# Patient Record
Sex: Male | Born: 1975 | Race: Black or African American | Hispanic: No | Marital: Married | Smoking: Current every day smoker
Health system: Southern US, Community
[De-identification: ages and names within clinical notes are randomized; demographics above are authoritative.]

## PROBLEM LIST (undated history)

## (undated) DIAGNOSIS — I10 Essential (primary) hypertension: Secondary | ICD-10-CM

## (undated) DIAGNOSIS — G8929 Other chronic pain: Secondary | ICD-10-CM

## (undated) DIAGNOSIS — M25569 Pain in unspecified knee: Secondary | ICD-10-CM

---

## 2005-10-26 ENCOUNTER — Emergency Department (HOSPITAL_COMMUNITY): Admission: EM | Admit: 2005-10-26 | Discharge: 2005-10-26 | Payer: Self-pay | Admitting: Emergency Medicine

## 2007-08-16 ENCOUNTER — Emergency Department (HOSPITAL_COMMUNITY): Admission: EM | Admit: 2007-08-16 | Discharge: 2007-08-16 | Payer: Self-pay | Admitting: Emergency Medicine

## 2008-07-14 ENCOUNTER — Emergency Department (HOSPITAL_COMMUNITY): Admission: EM | Admit: 2008-07-14 | Discharge: 2008-07-14 | Payer: Self-pay | Admitting: Emergency Medicine

## 2009-04-09 ENCOUNTER — Inpatient Hospital Stay (HOSPITAL_COMMUNITY): Admission: EM | Admit: 2009-04-09 | Discharge: 2009-04-10 | Payer: Self-pay | Admitting: Emergency Medicine

## 2009-04-09 ENCOUNTER — Encounter: Payer: Self-pay | Admitting: Internal Medicine

## 2009-04-09 ENCOUNTER — Ambulatory Visit: Payer: Self-pay | Admitting: Internal Medicine

## 2009-04-09 ENCOUNTER — Emergency Department (HOSPITAL_COMMUNITY): Admission: EM | Admit: 2009-04-09 | Discharge: 2009-04-09 | Payer: Self-pay | Admitting: Family Medicine

## 2009-04-18 ENCOUNTER — Encounter: Payer: Self-pay | Admitting: Internal Medicine

## 2009-04-25 ENCOUNTER — Telehealth: Payer: Self-pay | Admitting: Internal Medicine

## 2009-04-26 ENCOUNTER — Encounter: Payer: Self-pay | Admitting: Internal Medicine

## 2009-05-09 DIAGNOSIS — F1021 Alcohol dependence, in remission: Secondary | ICD-10-CM

## 2009-05-09 DIAGNOSIS — Z87891 Personal history of nicotine dependence: Secondary | ICD-10-CM

## 2009-05-09 DIAGNOSIS — I498 Other specified cardiac arrhythmias: Secondary | ICD-10-CM | POA: Insufficient documentation

## 2009-05-09 DIAGNOSIS — Z9189 Other specified personal risk factors, not elsewhere classified: Secondary | ICD-10-CM

## 2009-05-10 ENCOUNTER — Encounter (INDEPENDENT_AMBULATORY_CARE_PROVIDER_SITE_OTHER): Payer: Self-pay | Admitting: *Deleted

## 2009-10-02 ENCOUNTER — Emergency Department (HOSPITAL_COMMUNITY): Admission: EM | Admit: 2009-10-02 | Discharge: 2009-10-02 | Payer: Self-pay | Admitting: Family Medicine

## 2010-07-15 ENCOUNTER — Emergency Department (HOSPITAL_COMMUNITY)
Admission: EM | Admit: 2010-07-15 | Discharge: 2010-07-15 | Payer: Self-pay | Source: Home / Self Care | Admitting: Emergency Medicine

## 2010-07-20 ENCOUNTER — Emergency Department (HOSPITAL_COMMUNITY)
Admission: EM | Admit: 2010-07-20 | Discharge: 2010-07-20 | Payer: Self-pay | Source: Home / Self Care | Admitting: Emergency Medicine

## 2010-08-13 ENCOUNTER — Encounter: Payer: Self-pay | Admitting: Internal Medicine

## 2010-10-26 LAB — DIFFERENTIAL
Basophils Relative: 0 % (ref 0–1)
Eosinophils Absolute: 0.5 10*3/uL (ref 0.0–0.7)
Neutrophils Relative %: 61 % (ref 43–77)

## 2010-10-26 LAB — URINE DRUGS OF ABUSE SCREEN W ALC, ROUTINE (REF LAB)
Benzodiazepines.: NEGATIVE
Cocaine Metabolites: NEGATIVE
Opiate Screen, Urine: NEGATIVE
Phencyclidine (PCP): NEGATIVE
Propoxyphene: NEGATIVE

## 2010-10-26 LAB — CBC
HCT: 47.5 % (ref 39.0–52.0)
Hemoglobin: 16.2 g/dL (ref 13.0–17.0)
MCHC: 34.2 g/dL (ref 30.0–36.0)
MCHC: 34.6 g/dL (ref 30.0–36.0)
RBC: 5.05 MIL/uL (ref 4.22–5.81)
RDW: 13.9 % (ref 11.5–15.5)
RDW: 14.1 % (ref 11.5–15.5)
WBC: 7.9 10*3/uL (ref 4.0–10.5)

## 2010-10-26 LAB — COMPREHENSIVE METABOLIC PANEL
Calcium: 9 mg/dL (ref 8.4–10.5)
Chloride: 107 mEq/L (ref 96–112)
Creatinine, Ser: 1.02 mg/dL (ref 0.4–1.5)
GFR calc Af Amer: >60 mL/min (ref >60)
Glucose, Bld: 85 mg/dL (ref 70–99)
Potassium: 3.7 mEq/L (ref 3.5–5.1)
Sodium: 140 mEq/L (ref 135–145)
Total Bilirubin: 0.8 mg/dL (ref 0.3–1.2)

## 2010-10-26 LAB — CARDIAC PANEL(CRET KIN+CKTOT+MB+TROPI)
Relative Index: 0.6 (ref 0.0–2.5)
Total CK: 170 U/L (ref 7–232)

## 2010-10-26 LAB — LIPID PANEL
HDL: 42 mg/dL (ref >39)
LDL Cholesterol: 70 mg/dL (ref 0–99)
Total CHOL/HDL Ratio: 3.2 RATIO
Triglycerides: 110 mg/dL (ref <150)

## 2010-10-26 LAB — CK TOTAL AND CKMB (NOT AT ARMC)
Relative Index: 0.7 (ref 0.0–2.5)
Total CK: 267 U/L — ABNORMAL HIGH (ref 7–232)

## 2010-10-26 LAB — APTT: aPTT: 188 seconds — ABNORMAL HIGH (ref 24–37)

## 2010-10-26 LAB — PROTIME-INR: INR: 1 (ref 0.00–1.49)

## 2010-11-26 ENCOUNTER — Inpatient Hospital Stay (INDEPENDENT_AMBULATORY_CARE_PROVIDER_SITE_OTHER)
Admission: RE | Admit: 2010-11-26 | Discharge: 2010-11-26 | Disposition: A | Discharge status: Home / Self Care | Payer: Self-pay | Source: Ambulatory Visit | Attending: Emergency Medicine | Admitting: Emergency Medicine

## 2010-11-26 DIAGNOSIS — K5900 Constipation, unspecified: Secondary | ICD-10-CM

## 2010-11-26 DIAGNOSIS — K602 Anal fissure, unspecified: Secondary | ICD-10-CM

## 2011-04-11 LAB — POCT CARDIAC MARKERS
CKMB, poc: 1.8
Operator id: 265201

## 2013-05-06 ENCOUNTER — Encounter (HOSPITAL_COMMUNITY): Payer: Self-pay | Admitting: Emergency Medicine

## 2013-05-06 ENCOUNTER — Emergency Department (HOSPITAL_COMMUNITY)
Admission: EM | Admit: 2013-05-06 | Discharge: 2013-05-06 | Disposition: A | Discharge status: Home / Self Care | Payer: Self-pay | Attending: Emergency Medicine | Admitting: Emergency Medicine

## 2013-05-06 ENCOUNTER — Emergency Department (HOSPITAL_COMMUNITY): Payer: Self-pay

## 2013-05-06 DIAGNOSIS — I10 Essential (primary) hypertension: Secondary | ICD-10-CM | POA: Insufficient documentation

## 2013-05-06 DIAGNOSIS — Z87828 Personal history of other (healed) physical injury and trauma: Secondary | ICD-10-CM | POA: Insufficient documentation

## 2013-05-06 DIAGNOSIS — M25569 Pain in unspecified knee: Secondary | ICD-10-CM | POA: Insufficient documentation

## 2013-05-06 DIAGNOSIS — M25562 Pain in left knee: Secondary | ICD-10-CM

## 2013-05-06 DIAGNOSIS — F17200 Nicotine dependence, unspecified, uncomplicated: Secondary | ICD-10-CM | POA: Insufficient documentation

## 2013-05-06 DIAGNOSIS — G8929 Other chronic pain: Secondary | ICD-10-CM | POA: Insufficient documentation

## 2013-05-06 HISTORY — DX: Other chronic pain: G89.29

## 2013-05-06 HISTORY — DX: Pain in unspecified knee: M25.569

## 2013-05-06 HISTORY — DX: Essential (primary) hypertension: I10

## 2013-05-06 MED ORDER — NAPROXEN 500 MG PO TABS: 500.0000 mg | ORAL_TABLET | Freq: Two times a day (BID) | ORAL | Status: DC

## 2013-05-06 MED ORDER — IBUPROFEN 400 MG PO TABS
800.0000 mg | ORAL_TABLET | Freq: Once | ORAL | Status: AC
  Administered 2013-05-06: 800 mg via ORAL
  Filled 2013-05-06: qty 2
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
## 2013-05-06 NOTE — ED Notes (Signed)
Pt is here for left knee pain.  Pt denies any recent injury and has had pain in left knee for 10 years
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
## 2013-05-08 ENCOUNTER — Encounter (HOSPITAL_COMMUNITY): Payer: Self-pay | Admitting: Emergency Medicine

## 2013-05-08 ENCOUNTER — Emergency Department (HOSPITAL_COMMUNITY)
Admission: EM | Admit: 2013-05-08 | Discharge: 2013-05-08 | Disposition: A | Discharge status: Home / Self Care | Payer: Self-pay | Attending: Emergency Medicine | Admitting: Emergency Medicine

## 2013-05-08 DIAGNOSIS — G8929 Other chronic pain: Secondary | ICD-10-CM | POA: Insufficient documentation

## 2013-05-08 DIAGNOSIS — Z791 Long term (current) use of non-steroidal anti-inflammatories (NSAID): Secondary | ICD-10-CM | POA: Insufficient documentation

## 2013-05-08 DIAGNOSIS — I10 Essential (primary) hypertension: Secondary | ICD-10-CM | POA: Insufficient documentation

## 2013-05-08 DIAGNOSIS — F17200 Nicotine dependence, unspecified, uncomplicated: Secondary | ICD-10-CM | POA: Insufficient documentation

## 2013-05-08 DIAGNOSIS — R21 Rash and other nonspecific skin eruption: Secondary | ICD-10-CM | POA: Insufficient documentation

## 2013-05-08 MED ORDER — HYDROXYZINE HCL 25 MG PO TABS: 25.0000 mg | ORAL_TABLET | Freq: Four times a day (QID) | ORAL | Status: DC

## 2013-05-08 MED ORDER — HYDROXYZINE HCL 25 MG PO TABS
25.0000 mg | ORAL_TABLET | Freq: Once | ORAL | Status: AC
  Administered 2013-05-08: 25 mg via ORAL
  Filled 2013-05-08: qty 1

## 2013-05-08 MED ORDER — PERMETHRIN 5 % EX CREA: TOPICAL_CREAM | CUTANEOUS | Status: DC

## 2013-05-08 NOTE — ED Provider Notes (Signed)
CSN: [id number]     Arrival date & time 05/08/13  1158 History  This chart was scribed for non-physician practitioner, Junious Silk, PA-C working with Doug Sou, MD by Greggory Stallion, ED scribe. This patient was seen in room TR03C/TR03C and the patient's care was started at 12:13 PM.   Chief Complaint  Patient presents with  . Rash   The history is provided by the patient. No language interpreter was used.   HPI Comments: Jonathan Caldwell is a 37 y.o. male who presents to the Emergency Department complaining of gradual onset, constant diffuse itchy rash that started one day ago. Pt states the rash started on his hands and has moved up his arms and to the rest of his body. He has not seen any bugs. He has not used any new products or soaps. No one else has this rash. Pt denies mouth sores, trouble breathing, SOB, trouble swallowing.   Past Medical History  Diagnosis Date  . Chronic knee pain   . Hypertension    No past surgical history on file. No family history on file. History  Substance Use Topics  . Smoking status: Current Every Day Smoker -- 0.50 packs/day  . Smokeless tobacco: Not on file  . Alcohol Use: Yes    Review of Systems  HENT: Negative for mouth sores and trouble swallowing.   Respiratory: Negative for shortness of breath.   Skin: Positive for rash.  All other systems reviewed and are negative.    Allergies  Review of patient's allergies indicates no known allergies.  Home Medications   Current Outpatient Rx  Name  Route  Sig  Dispense  Refill  . naproxen (NAPROSYN) 500 MG tablet   Oral   Take 1 tablet (500 mg total) by mouth 2 (two) times daily.   30 tablet   0    BP 148/95  Pulse 78  Temp(Src) 98.7 F (37.1 C) (Oral)  Resp 20  Ht 5\' 8"  (1.727 m)  Wt 214 lb (97.07 kg)  BMI 32.55 kg/m2  SpO2 95%  Physical Exam  Nursing note and vitals reviewed. Constitutional: He is oriented to person, place, and time. He appears well-developed and  well-nourished. No distress.  HENT:  Head: Normocephalic and atraumatic.  Right Ear: External ear normal.  Left Ear: External ear normal.  Nose: Nose normal.  No uvular swelling. Maintaining secretions. No sores on face or in mouth.  Eyes: Conjunctivae are normal.  Neck: Normal range of motion. No tracheal deviation present.  Cardiovascular: Normal rate, regular rhythm and normal heart sounds.   Pulmonary/Chest: Effort normal and breath sounds normal. No stridor. No respiratory distress.  Abdominal: Soft. He exhibits no distension. There is no tenderness.  Musculoskeletal: Normal range of motion.  Neurological: He is alert and oriented to person, place, and time.  Skin: Skin is warm and dry. He is not diaphoretic.  Scattered papules diffusely over upper extremities bilaterally. Some excoriations noted.   Psychiatric: He has a normal mood and affect. His behavior is normal.    ED Course  Procedures (including critical care time)  DIAGNOSTIC STUDIES: Oxygen Saturation is 95% on RA, adequate by my interpretation.    COORDINATION OF CARE: 12:15 PM-Discussed treatment plan which includes cream with pt at bedside and pt agreed to plan.   Labs Review Labs Reviewed - No data to display Imaging Review No results found.  EKG Interpretation   None       MDM   1. Rash  Patient presents with pruritic rash diffusely over arms. Consistent with bite. No sensation that throat is closing, maintaining secretions. Will treat with Elimite cream and Atarax. Discussed that he needs to apply Elimite cream from neck to soles of feet and leave on for 14 hours and then rinse. Repeat in 1 week if still having symptoms. Also discussed the use of Nix spray on all couches and washing of all sheets. Reasons to return to ED were discussed. Vital signs stable for discharge. Patient / Family / Caregiver informed of clinical course, understand medical decision-making process, and agree with plan.     I  personally performed the services described in this documentation, which was scribed in my presence. The recorded information has been reviewed and is accurate.    Mora Bellman, PA-C 05/08/13 [phone]

## 2013-05-08 NOTE — ED Provider Notes (Signed)
Medical screening examination/treatment/procedure(s) were performed by non-physician practitioner and as supervising physician I was immediately available for consultation/collaboration.  Ambrosia Wisnewski, MD 05/08/13 1540 

## 2013-05-08 NOTE — ED Notes (Signed)
Reports itching and rash to entire body, including arms and hands since yesterday. No acute distress noted.

## 2013-05-11 NOTE — ED Provider Notes (Signed)
Medical screening examination/treatment/procedure(s) were performed by non-physician practitioner and as supervising physician I was immediately available for consultation/collaboration.  Libby Goehring [place], MD 05/11/13 0042 

## 2013-08-24 ENCOUNTER — Emergency Department (HOSPITAL_COMMUNITY): Payer: Self-pay

## 2013-08-24 ENCOUNTER — Emergency Department (HOSPITAL_COMMUNITY)
Admission: EM | Admit: 2013-08-24 | Discharge: 2013-08-25 | Disposition: A | Discharge status: Home / Self Care | Payer: Self-pay | Attending: Emergency Medicine | Admitting: Emergency Medicine

## 2013-08-24 ENCOUNTER — Encounter (HOSPITAL_COMMUNITY): Payer: Self-pay | Admitting: Emergency Medicine

## 2013-08-24 DIAGNOSIS — K089 Disorder of teeth and supporting structures, unspecified: Secondary | ICD-10-CM | POA: Insufficient documentation

## 2013-08-24 DIAGNOSIS — F17200 Nicotine dependence, unspecified, uncomplicated: Secondary | ICD-10-CM | POA: Insufficient documentation

## 2013-08-24 DIAGNOSIS — R079 Chest pain, unspecified: Secondary | ICD-10-CM | POA: Insufficient documentation

## 2013-08-24 DIAGNOSIS — K0889 Other specified disorders of teeth and supporting structures: Secondary | ICD-10-CM

## 2013-08-24 DIAGNOSIS — G8929 Other chronic pain: Secondary | ICD-10-CM | POA: Insufficient documentation

## 2013-08-24 DIAGNOSIS — K029 Dental caries, unspecified: Secondary | ICD-10-CM | POA: Insufficient documentation

## 2013-08-24 DIAGNOSIS — I10 Essential (primary) hypertension: Secondary | ICD-10-CM | POA: Insufficient documentation

## 2013-08-24 LAB — POCT I-STAT TROPONIN I
Troponin i, poc: 0.01 ng/mL (ref 0.00–0.08)
Troponin i, poc: 0.01 ng/mL (ref 0.00–0.08)

## 2013-08-24 LAB — CBC
HEMATOCRIT: 42.1 % (ref 39.0–52.0)
HEMOGLOBIN: 15 g/dL (ref 13.0–17.0)
MCH: 31.5 pg (ref 26.0–34.0)
MCHC: 35.6 g/dL (ref 30.0–36.0)
MCV: 88.4 fL (ref 78.0–100.0)
Platelets: 167 10*3/uL (ref 150–400)
RBC: 4.76 MIL/uL (ref 4.22–5.81)
RDW: 13.3 % (ref 11.5–15.5)
WBC: 10.5 10*3/uL (ref 4.0–10.5)

## 2013-08-24 LAB — BASIC METABOLIC PANEL
BUN: 14 mg/dL (ref 6–23)
CO2: 23 meq/L (ref 19–32)
Calcium: 9.1 mg/dL (ref 8.4–10.5)
Chloride: 101 mEq/L (ref 96–112)
Creatinine, Ser: 1.12 mg/dL (ref 0.50–1.35)
GFR calc Af Amer: >90 mL/min (ref >90)
GFR, EST NON AFRICAN AMERICAN: 82 mL/min — AB (ref >90)
GLUCOSE: 84 mg/dL (ref 70–99)
POTASSIUM: 3.9 meq/L (ref 3.7–5.3)
Sodium: 137 mEq/L (ref 137–147)

## 2013-08-24 LAB — PRO B NATRIURETIC PEPTIDE: Pro B Natriuretic peptide (BNP): 180.8 pg/mL — ABNORMAL HIGH (ref 0–125)

## 2013-08-24 MED ORDER — HYDROCODONE-ACETAMINOPHEN 5-325 MG PO TABS: 1.0000 | ORAL_TABLET | Freq: Four times a day (QID) | ORAL | Status: DC | PRN

## 2013-08-24 MED ORDER — FAMOTIDINE 20 MG PO TABS: 20.0000 mg | ORAL_TABLET | Freq: Two times a day (BID) | ORAL | Status: DC

## 2013-08-24 MED ORDER — PENICILLIN V POTASSIUM 500 MG PO TABS: 500.0000 mg | ORAL_TABLET | Freq: Four times a day (QID) | ORAL | Status: AC

## 2013-08-24 MED ORDER — HYDROCODONE-ACETAMINOPHEN 5-325 MG PO TABS
1.0000 | ORAL_TABLET | Freq: Once | ORAL | Status: AC
  Administered 2013-08-25: 1 via ORAL
  Filled 2013-08-24: qty 1

## 2013-08-24 MED ORDER — GI COCKTAIL ~~LOC~~
30.0000 mL | Freq: Once | ORAL | Status: AC
  Administered 2013-08-24: 30 mL via ORAL
  Filled 2013-08-24: qty 30

## 2013-08-24 NOTE — ED Provider Notes (Signed)
CSN: [id number]     Arrival date & time 08/24/13  2025 History   First MD Initiated Contact with Patient 08/24/13 2036     Chief Complaint  Patient presents with  . Chest Pain   (Consider location/radiation/quality/duration/timing/severity/associated sxs/prior Treatment) HPI Jonathan Caldwell is a 38 y.o. male who presents to ED with complaint of chest pain. Pt states his pain began around 4:30 PM after eating late lunch. States was sitting down. States pain described as sharp, retrosternal. Denied any associated sob, nausea, dizziness, diaphoresis. States hx of CP several years ago, was workedvup at that time, negative stress test. Was diagnosed with HTN, not taking any medications for it. Pt states he is unsure of what his cholesterol is, no hx of diabetes. States he is a smoker. Denies any significant family history of cardiac disease. No exertional symptoms. Took BC powder prior to coming in.   Past Medical History  Diagnosis Date  . Chronic knee pain   . Hypertension    History reviewed. No pertinent past surgical history. No family history on file. History  Substance Use Topics  . Smoking status: Current Every Day Smoker -- 0.50 packs/day  . Smokeless tobacco: Not on file  . Alcohol Use: Yes    Review of Systems  Constitutional: Negative for fever and chills.  HENT: Positive for dental problem.   Respiratory: Negative for cough, chest tightness and shortness of breath.   Cardiovascular: Positive for chest pain. Negative for palpitations and leg swelling.  Gastrointestinal: Negative for nausea, vomiting, abdominal pain, diarrhea and abdominal distention.  Genitourinary: Negative for dysuria.  Musculoskeletal: Negative for arthralgias, myalgias, neck pain and neck stiffness.  Skin: Negative for rash.  Allergic/Immunologic: Negative for immunocompromised state.  Neurological: Negative for dizziness, weakness, light-headedness, numbness and headaches.    Allergies  Review of  patient's allergies indicates no known allergies.  Home Medications   Current Outpatient Rx  Name  Route  Sig  Dispense  Refill  . Aspirin-Salicylamide-Caffeine (BC HEADACHE) 325-95-16 MG TABS   Oral   Take 1 Package by mouth daily as needed (for headache).         Marland Kitchen ibuprofen (ADVIL,MOTRIN) 200 MG tablet   Oral   Take 200 mg by mouth every 6 (six) hours as needed for moderate pain.          BP 153/102  Pulse 51  Temp(Src) 97.5 F (36.4 C) (Oral)  Resp 8  SpO2 98% Physical Exam  Nursing note and vitals reviewed. Constitutional: He is oriented to person, place, and time. He appears well-developed and well-nourished. No distress.  HENT:  Head: Normocephalic and atraumatic.  Left lower 2nd molar cavity. Tender to palpation. No trismus. No swelling under tongue. No abscess  Eyes: Conjunctivae are normal.  Neck: Neck supple.  Cardiovascular: Normal rate, regular rhythm and normal heart sounds.   Pulmonary/Chest: Effort normal. No respiratory distress. He has no wheezes. He has no rales.  Abdominal: Soft. Bowel sounds are normal. He exhibits no distension. There is no tenderness. There is no rebound.  Musculoskeletal: He exhibits no edema.  Neurological: He is alert and oriented to person, place, and time.  Skin: Skin is warm and dry.    ED Course  Procedures (including critical care time) Labs Review Labs Reviewed  BASIC METABOLIC PANEL - Abnormal; Notable for the following:    GFR calc non Af Amer 82 (*)    All other components within normal limits  PRO B NATRIURETIC PEPTIDE - Abnormal; Notable for  the following:    Pro B Natriuretic peptide (BNP) 180.8 (*)    All other components within normal limits  CBC  POCT I-STAT TROPONIN I   Imaging Review Dg Chest 2 View  08/24/2013   CLINICAL DATA:  Left-sided chest pain.  EXAM: CHEST  2 VIEW  COMPARISON:  04/09/2009 and 07/14/2008  FINDINGS: The heart size and mediastinal contours are within normal limits. Both lungs are  clear. The visualized skeletal structures are unremarkable.  IMPRESSION: Normal chest.   Electronically Signed   By: Geanie CooleyJim  Maxwell M.D.   On: 08/24/2013 20:59    EKG Interpretation    Date/Time:  Tuesday August 24 2013 20:26:29 EST Ventricular Rate:  64 PR Interval:  160 QRS Duration: 92 QT Interval:  400 QTC Calculation: 412 R Axis:   83 Text Interpretation:  Normal sinus rhythm Nonspecific T wave abnormality No significant change since last tracing Confirmed by DOCHERTY  MD, MEGAN ([id number]) on 08/24/2013 8:37:31 PM            MDM   1. Chest pain   2. Toothache      10:54 PM Symptoms resolved with GI cocktail. Will do delta trop at 6hrs. Pt states symptoms began at 4:30.  At this time labs negative. CXR negative.  Will continue to monitor.   11:50 PM Pt now complaining of worsening dental pain. CP free. Troponin x2 negative. Will treat with norco, penicillin. Follow up with pcp and dentist as soon as able.   Filed Vitals:   08/24/13 2105 08/24/13 2130 08/24/13 2200 08/24/13 2230  BP: 152/109 155/98 156/104 153/102  Pulse: 57 55 52 51  Temp:      TempSrc:      Resp:  12 18 8   SpO2: 97% 98% 98% 98%       Shatona Andujar A Waylyn Tenbrink, PA-C 08/24/13 2350

## 2013-08-24 NOTE — ED Notes (Signed)
Pt. reports left chest pain onset today , non radiating with slight SOB , denies nausea or diaphoresis .

## 2013-08-24 NOTE — Discharge Instructions (Signed)
Take pepcid as prescribed for chest pain- possible acid reflux. Follow up with primary care doctor. Take penicillin, ibuprofen, and norco for severe dental pain. Please follow up with a dentist. Return if worsening.    Chest Pain (Nonspecific) Chest pain has many causes. Your pain could be caused by something serious, such as a heart attack or a blood clot in the lungs. It could also be caused by something less serious, such as a chest bruise or a virus. Follow up with your doctor. More lab tests or other studies may be needed to find the cause of your pain. Most of the time, nonspecific chest pain will improve within 2 to 3 days of rest and mild pain medicine. HOME CARE  For chest bruises, you may put ice on the sore area for 15-20 minutes, 03-04 times a day. Do this only if it makes you feel better.  Put ice in a plastic bag.  Place a towel between the skin and the bag.  Rest for the next 2 to 3 days.  Go back to work if the pain improves.  See your doctor if the pain lasts longer than 1 to 2 weeks.  Only take medicine as told by your doctor.  Quit smoking if you smoke. GET HELP RIGHT AWAY IF:   There is more pain or pain that spreads to the arm, neck, jaw, back, or belly (abdomen).  You have shortness of breath.  You cough more than usual or cough up blood.  You have very bad back or belly pain, feel sick to your stomach (nauseous), or throw up (vomit).  You have very bad weakness.  You pass out (faint).  You have a fever. Any of these problems may be serious and may be an emergency. Do not wait to see if the problems will go away. Get medical help right away. Call your local emergency services 911 in [place] Do not drive yourself to the hospital. MAKE SURE YOU:   Understand these instructions.  Will watch this condition.  Will get help right away if you or your child is not doing well or gets worse. Document Released: 12/25/2007 Document Revised: 09/30/2011 Document  Reviewed: 12/25/2007 [facility] Patient Information 2014 [place], [place].   Emergency Department Resource Guide 1) Find a Doctor and Pay Out of Pocket Although you won't have to find out who is covered by your insurance plan, it is a good idea to ask around and get recommendations. You will then need to call the office and see if the doctor you have chosen will accept you as a new patient and what types of options they offer for patients who are self-pay. Some doctors offer discounts or will set up payment plans for their patients who do not have insurance, but you will need to ask so you aren't surprised when you get to your appointment.  2) Contact Your Local Health Department Not all health departments have doctors that can see patients for sick visits, but many do, so it is worth a call to see if yours does. If you don't know where your local health department is, you can check in your phone book. The CDC also has a tool to help you locate your state's health department, and many state websites also have listings of all of their local health departments.  3) Find a Walk-in Clinic If your illness is not likely to be very severe or complicated, you may want to try a walk in clinic. These are popping  up all over the country in pharmacies, drugstores, and shopping centers. They're usually staffed by nurse practitioners or physician assistants that have been trained to treat common illnesses and complaints. They're usually fairly quick and inexpensive. However, if you have serious medical issues or chronic medical problems, these are probably not your best option.  No Primary Care Doctor: - Call Health Connect at  [phone] - they can help you locate a primary care doctor that  accepts your insurance, provides certain services, etc. - Physician Referral Service- [phone]  Chronic Pain Problems: Organization         Address  Phone   Notes  [facility]  [phone] Patients  need to be referred by their primary care doctor.   Medication Assistance: Organization         Address  Phone   Notes  [facility] Medication [facility] [address] [place], [place] [address] [phone] --Must be a resident of [facility] -- Must have NO insurance coverage whatsoever (no Medicaid/ Medicare, etc.) -- The pt. MUST have a primary care doctor that directs their care regularly and follows them in the community   [facility]  [phone]   [employer]  [phone]    Agencies that provide inexpensive medical care: Organization         Address  Phone   Notes  [facility]  [phone]   [facility]    [phone]   [facility] [address] [place], [place] [address] [phone]   [facility] [address] [place] [place] [phone]   Planned Parenthood    [phone]   [facility]    [phone]   [facility]  [address], [place] Phone:  [phone], Fax:  [phone] Hours of Operation:  9 am - 6 pm, M-F.  Also accepts Medicaid/Medicare and self-pay.  [facility]  [address], [place] Phone: [phone], Fax: [phone]. Hours of Operation:  8:30 am - 5:30 pm, M-F.  Also accepts Medicaid and self-pay.  [facility] [place] [address], [place] Phone: [phone]   [facility] [address] Natasha Bence [place], [place] [phone], Ext. [phone] Mondays & Thursdays: 7-9 AM.  First 15 patients are seen on a first come, first serve basis.    Medicaid-accepting [facility] Providers:  Organization         Address  Phone   Notes  [facility] [address], [place] [phone] Also accepts self-pay patients.  [facility] [address] Laurell Josephs [place], [place]  [phone]     [facility] [address], [place] [phone]   [facility] [address], [place] [phone]   Renaye Rakers [address], [place]   [phone] Only accepts [place] patients after they have their name applied to their card.   Self-Pay (no insurance) in [facility]:  Organization         Address  Phone   Notes  Sickle Cell Patients, [facility] [address] [place], [place] [phone]   [facility] [address] [place], [place] [phone]   [facility] [place]  [address], [place] [phone]   [facility]/Dr. Osei-Bonsu  [address], [place] or [address], [place] [phone] Phone number for both [place] and [place] locations is the same.  [facility] [address], [place] [phone]   [facility] [address], [place] or [address] [phone]   [facility] [address] [place] [phone], phone; [phone], fax Sees patients 1st and 3rd Saturday of every month.  Must not qualify for public or private insurance (i.e. Medicaid, Medicare, [place] Health Choice, Veterans' Benefits)  Household income should be no more than 200% of the poverty level The clinic cannot treat you if you are pregnant or think you are pregnant  Sexually transmitted diseases are not treated at the clinic.    Dental Care: Organization         Address  Phone  Notes  [facility] Department of [facility] [address] [place], [place] [phone] Accepts children up to age 56 who are enrolled in [place] or [place] Health Choice; pregnant women with a Medicaid card; and children who have applied for Medicaid or [place] Health Choice, but were declined,  whose parents can pay a reduced fee at time of service.  [facility]  [address], [place] [phone] Accepts children up to age 48 who are enrolled in [place] or [place] Health Choice; pregnant women with a Medicaid card; and children who have applied for Medicaid or [place] Health Choice, but were declined, whose parents can pay a reduced fee at time of service.  [facility]  [address] [place], [place] [phone] Patients are seen by appointment only. Walk-ins are not accepted. [facility] will see patients 59 years of age and older. Monday - Tuesday (8am-5pm) Most Wednesdays (8:30-5pm) $30 per visit, cash only  [facility]  [address], [facility] [phone] Patients are seen by appointment only. Walk-ins are not accepted. [facility] will see patients 73 years of age and older. One Wednesday Evening (Monthly: Volunteer Based).  $30 per visit, cash only  [employer]  [phone] for adults; Children under age 4, call Graduate Pediatric Dentistry at [phone]. Children aged 65-14, please call [phone] to request a pediatric application.  Dental services are provided in all areas of dental care including fillings, crowns and bridges, complete and partial dentures, implants, gum treatment, root canals, and extractions. Preventive care is also provided. Treatment is provided to both adults and children. Patients are selected via a lottery and there is often a waiting list.   [facility] [address] [place] [employer]  [phone] [email or web address]   [facility] [address] [place], [place] [phone], Ext. [phone] Second and Fourth Thursday of each month, opens at 6:30 AM; Clinic ends at 9 AM.  Patients are seen on a first-come first-served basis, and a limited number are seen during each clinic.   [facility]  [address] Ether Griffins [place], [place] [phone]   Eligibility Requirements You must have lived in [place], [place], or [place] counties for at least the last three months.   You cannot be eligible for state or federal sponsored [place], including [employer], [place],  or Medicare.   You generally cannot be eligible for healthcare insurance through your employer.    How to apply: Eligibility screenings are held every Tuesday and Wednesday afternoon from 1:00 pm until 4:00 pm. You do not need an appointment for the interview!  [facility] [address], [place], [place] [phone]   [facility]  [phone]   [facility]  [phone]   [facility]  [phone]    Behavioral Health Resources in the Community: Intensive Outpatient Programs Organization         Address  Phone  Notes  [facility] [address], [place], [place] [phone]   [facility] [address], [place], [place] [phone]   [facility] [address], [place], [place]  [phone]   [facility] [address],  [place], [place] [phone] or [phone]   Substance Abuse Resources Organization         Address  Phone  Notes  [facility]  [phone]   [facility]  [phone]   [facility]  [phone]   [facility]  [phone]   Residential & Outpatient Substance Abuse Program  [phone]   Psychological Services Organization         Address  Phone  Notes  [facility]  [phone]- [phone]   [facility]  [phone]- [phone]   [facility] [address], [place] [phone] or [phone]    Mobile Crisis Teams Organization         Address  Phone  Notes  [facility], Mobile Crisis Care Unit   [phone]   [facility]  [address] [place], [place] [phone]   Doristine LocksSharon DeEsch [address] [place] [phone]    Self-Help/Support Groups Organization         Address  Phone             Notes  [facility] [place] - variety of support groups  [phone]- [phone] Call for more information  Narcotics Anonymous (NA), [facility] [address], [place]  2 meetings at this location   Statisticianesidential Treatment Programs Organization         Address  Phone  Notes  [facility] [address],    [place]  [phone]   [facility]  [address], [place] [phone], [place], [place] [phone]   [facility] [address], [place] [phone] Admissions: 8am-3pm M-F  [facility] [address]    [place], [place] [phone]   [facility] [address], [place], [place] [phone]   [facility] [address]  [place], [place] [phone]   [facility] - Intensive Outpatient [address], [place], [place] [phone]   [facility] ([facility] [address]  [place], [place] [phone] or [phone]   [facility] ([facility]) [address] [place], [place] [phone] Accepts Medicaid  [facility] [address]  [place] [phone] Substance Abuse/Addiction Treatment   [facility] Resources Organization         Address  Phone  Notes  [facility]  [phone]) [phone]   Angie FavaJulie Brannon, PhD [address] M[address]yn Skeeters [place], [place]   [phone] or [phone]   [facility]al   [address] [place], [place] [phone]   [facility] [address], [place], [place] [phone] Insurance/Medicaid/sponsorship through [facility] and Families [address]                                     [place], [place] [phone] Therapy/tele-psych/case  [facility] [address]   [place], [place] [phone]    Dr. Lolly Mustache  [phone]   [facility]  [employer] [facility] Dept. 1) [address], [place] 2) [address] [place] 3)  [address], [place] [phone]  [phone]   [facility] Child Abuse Hotline (509)[phone] or 8474257907 (After Hours)

## 2013-08-24 NOTE — ED Notes (Signed)
Family at bedside. 

## 2013-08-25 NOTE — ED Provider Notes (Signed)
Medical screening examination/treatment/procedure(s) were performed by non-physician practitioner and as supervising physician I was immediately available for consultation/collaboration.  EKG Interpretation    Date/Time:  Tuesday August 24 2013 20:26:29 EST Ventricular Rate:  64 PR Interval:  160 QRS Duration: 92 QT Interval:  400 QTC Calculation: 412 R Axis:   83 Text Interpretation:  Normal sinus rhythm Nonspecific T wave abnormality No significant change since last tracing Confirmed by Chiron Campione  MD, Earlin Sweeden ([id number]) on 08/24/2013 8:37:31 PM              Shanna CiscoMegan E Kylii Ennis, MD 08/25/13 0006

## 2013-08-26 ENCOUNTER — Encounter (HOSPITAL_COMMUNITY): Payer: Self-pay | Admitting: Emergency Medicine

## 2013-08-26 ENCOUNTER — Emergency Department (HOSPITAL_COMMUNITY)
Admission: EM | Admit: 2013-08-26 | Discharge: 2013-08-26 | Disposition: A | Discharge status: Home / Self Care | Payer: Self-pay | Attending: Emergency Medicine | Admitting: Emergency Medicine

## 2013-08-26 ENCOUNTER — Emergency Department (HOSPITAL_COMMUNITY)
Admission: EM | Admit: 2013-08-26 | Discharge: 2013-08-26 | Discharge status: Against Medical Advice | Payer: Self-pay | Attending: Emergency Medicine | Admitting: Emergency Medicine

## 2013-08-26 DIAGNOSIS — R0789 Other chest pain: Secondary | ICD-10-CM | POA: Insufficient documentation

## 2013-08-26 DIAGNOSIS — Z79899 Other long term (current) drug therapy: Secondary | ICD-10-CM | POA: Insufficient documentation

## 2013-08-26 DIAGNOSIS — K219 Gastro-esophageal reflux disease without esophagitis: Secondary | ICD-10-CM | POA: Insufficient documentation

## 2013-08-26 DIAGNOSIS — F17200 Nicotine dependence, unspecified, uncomplicated: Secondary | ICD-10-CM | POA: Insufficient documentation

## 2013-08-26 DIAGNOSIS — Z792 Long term (current) use of antibiotics: Secondary | ICD-10-CM | POA: Insufficient documentation

## 2013-08-26 DIAGNOSIS — I10 Essential (primary) hypertension: Secondary | ICD-10-CM | POA: Insufficient documentation

## 2013-08-26 DIAGNOSIS — R079 Chest pain, unspecified: Secondary | ICD-10-CM

## 2013-08-26 DIAGNOSIS — R030 Elevated blood-pressure reading, without diagnosis of hypertension: Secondary | ICD-10-CM

## 2013-08-26 DIAGNOSIS — G8929 Other chronic pain: Secondary | ICD-10-CM | POA: Insufficient documentation

## 2013-08-26 DIAGNOSIS — IMO0001 Reserved for inherently not codable concepts without codable children: Secondary | ICD-10-CM

## 2013-08-26 LAB — CBC
HCT: 46.4 % (ref 39.0–52.0)
Hemoglobin: 16.3 g/dL (ref 13.0–17.0)
MCH: 31.5 pg (ref 26.0–34.0)
MCHC: 35.1 g/dL (ref 30.0–36.0)
MCV: 89.6 fL (ref 78.0–100.0)
PLATELETS: 173 10*3/uL (ref 150–400)
RBC: 5.18 MIL/uL (ref 4.22–5.81)
RDW: 13.7 % (ref 11.5–15.5)
WBC: 9.9 10*3/uL (ref 4.0–10.5)

## 2013-08-26 LAB — BASIC METABOLIC PANEL
BUN: 16 mg/dL (ref 6–23)
CO2: 25 meq/L (ref 19–32)
CREATININE: 1.19 mg/dL (ref 0.50–1.35)
Calcium: 9.7 mg/dL (ref 8.4–10.5)
Chloride: 106 mEq/L (ref 96–112)
GFR calc non Af Amer: 77 mL/min — ABNORMAL LOW (ref >90)
GFR, EST AFRICAN AMERICAN: 89 mL/min — AB (ref >90)
Glucose, Bld: 93 mg/dL (ref 70–99)
Potassium: 4.1 mEq/L (ref 3.7–5.3)
SODIUM: 143 meq/L (ref 137–147)

## 2013-08-26 LAB — POCT I-STAT TROPONIN I
TROPONIN I, POC: 0 ng/mL (ref 0.00–0.08)
Troponin i, poc: 0.01 ng/mL (ref 0.00–0.08)

## 2013-08-26 MED ORDER — GI COCKTAIL ~~LOC~~
30.0000 mL | Freq: Once | ORAL | Status: AC
  Administered 2013-08-26: 30 mL via ORAL
  Filled 2013-08-26: qty 30

## 2013-08-26 MED ORDER — HYDROCHLOROTHIAZIDE 25 MG PO TABS: 12.5000 mg | ORAL_TABLET | Freq: Every day | ORAL | Status: DC

## 2013-08-26 NOTE — ED Provider Notes (Signed)
CSN: [id number]     Arrival date & time 08/26/13  1718 History  This chart was scribed for non-physician practitioner Raymon MuttonMarissa Dameshia Seybold, PA-C, working with Flint MelterElliott L Wentz, MD by Nicholos Johnsenise Iheanachor, ED scribe. This patient was seen in room WTR5/WTR5 and the patient's care was started at 6:26 PM.   Chief Complaint  Patient presents with  . Chest Pain   The history is provided by the patient. No language interpreter was used.   HPI Comments: Jonathan Caldwell is a 38 y.o. male who presents to the Emergency Department complaining of chest pain. Pt was seen on 08/24/13 for chest pain and tooth ache. Normal chest x-ray and EKG were noted during that visit. [facility]. Results reported negative CBC, BMP, I-stat triponin with negative elevation. Pt states he was wiating too long over there and decided to come [place] [facility]. Pt was prescribed Pepcid at discharge on 08/24/2013, but did not fill prescription.  States pain now moves back and forth between center of chest and back. Pain does not usually wake him in the mornings. Says he woke up this morning with pain that was bearable but says by the time he got to starting his day, the pain was much worse. Episodes are usually after eating; reports eating spicy food regularly. States he will usually lay down after eating if it is at night. Pt is [place] and girlfriend states there is a lot of grease, oil, and spice used in foods she cooks. Reports pain feels more gnawing than it does tight. Relief when standing. States he had some SOB 2 days ago that have since resolved. Denies hx of blood clots, or recent surgery. Denies any recent travel or sitting for long periods of time recently. Has not been taking any medications for long term. Reports HTN that he used to take medication for but has since stopped. Pt was also taking ibuprofen to treat tooth ache and was using BC power every 2-3 hours to help treat tooth pain. Pt does not have PCP. Denies  burning sensation in the chest, difficulty breathing, SOB, nausea, vomitting, diarrhea, hematchezia, sore throat, trouble swallowing, leg swelling.  Past Medical History  Diagnosis Date  . Chronic knee pain   . Hypertension    History reviewed. No pertinent past surgical history. History reviewed. No pertinent family history. History  Substance Use Topics  . Smoking status: Current Every Day Smoker -- 0.25 packs/day    Types: Cigarettes  . Smokeless tobacco: Not on file  . Alcohol Use: Yes    Review of Systems  HENT: Negative for sore throat and trouble swallowing.   Respiratory: Negative for shortness of breath.   Cardiovascular: Positive for chest pain. Negative for leg swelling.  Gastrointestinal: Negative for nausea, vomiting, diarrhea and blood in stool.  All other systems reviewed and are negative.   Allergies  Review of patient's allergies indicates no known allergies.  Home Medications   Current Outpatient Rx  Name  Route  Sig  Dispense  Refill  . HYDROcodone-acetaminophen (NORCO) 5-325 MG per tablet   Oral   Take 1 tablet by mouth every 6 (six) hours as needed for moderate pain.   20 tablet   0   . penicillin v potassium (VEETID) 500 MG tablet   Oral   Take 1 tablet (500 mg total) by mouth 4 (four) times daily.   40 tablet   0   . famotidine (PEPCID) 20 MG tablet   Oral   Take  20 mg by mouth 2 (two) times daily as needed for heartburn or indigestion.         . hydrochlorothiazide (HYDRODIURIL) 25 MG tablet   Oral   Take 0.5 tablets (12.5 mg total) by mouth daily.   15 tablet   0    Triage Vitals: BP 172/108  Pulse 58  Temp(Src) 98.1 F (36.7 C) (Oral)  Resp 18  SpO2 98% Physical Exam  Nursing note and vitals reviewed. Constitutional: He is oriented to person, place, and time. He appears well-developed and well-nourished. No distress.  HENT:  Head: Normocephalic and atraumatic.  Mouth/Throat: Oropharynx is clear and moist. No oropharyngeal  exudate.  Eyes: Conjunctivae and EOM are normal. Pupils are equal, round, and reactive to light. Right eye exhibits no discharge. Left eye exhibits no discharge.  Neck: Normal range of motion. Neck supple.  Cardiovascular: Normal rate, regular rhythm and normal heart sounds.  Exam reveals no friction rub.   No murmur heard. Pulses:      Radial pulses are 2+ on the right side, and 2+ on the left side.  Pulmonary/Chest: Effort normal and breath sounds normal. No respiratory distress. He has no wheezes. He has no rales. He exhibits no tenderness.  Musculoskeletal: Normal range of motion.  Full ROM to upper and lower extremities without difficulty noted, negative ataxia noted.  Neurological: He is alert and oriented to person, place, and time. He exhibits normal muscle tone. Coordination normal.  Skin: Skin is warm and dry. No rash noted. He is not diaphoretic. No erythema.  Psychiatric: He has a normal mood and affect. His behavior is normal. Thought content normal.    ED Course  Procedures (including critical care time) DIAGNOSTIC STUDIES: Oxygen Saturation is 98% on room air, normal by my interpretation.    COORDINATION OF CARE: At 6:37 PM: Discussed treatment plan with patient which includes a GI cocktail. Patient agrees.   8:39 PM This provider re-assessed the patient. Reported that he is feeling much better and that the GI cocktail has decreased his pain.   Results for orders placed during the hospital encounter of 08/26/13  POCT I-STAT TROPONIN I      Result Value Range   Troponin i, poc 0.00  0.00 - 0.08 ng/mL   Comment 3              Labs Review Labs Reviewed  POCT I-STAT TROPONIN I   Imaging Review Dg Chest 2 View  08/24/2013   CLINICAL DATA:  Left-sided chest pain.  EXAM: CHEST  2 VIEW  COMPARISON:  04/09/2009 and 07/14/2008  FINDINGS: The heart size and mediastinal contours are within normal limits. Both lungs are clear. The visualized skeletal structures are  unremarkable.  IMPRESSION: Normal chest.   Electronically Signed   By: Geanie Cooley M.D.   On: 08/24/2013 20:59   EKG Interpretation   None      Date: 08/26/2013  Rate: 59  Rhythm: normal sinus rhythm  QRS Axis: normal  Intervals: normal  ST/T Wave abnormalities: normal  Conduction Disutrbances:none  Narrative Interpretation:   Old EKG Reviewed: unchanged EKG analyzed reviewed by this provider and attending physician.    MDM   1. GERD (gastroesophageal reflux disease)   2. Chest pain   3. Elevated blood pressure     Filed Vitals:   08/26/13 1723  BP: 172/108  Pulse: 58  Temp: 98.1 F (36.7 C)  TempSrc: Oral  Resp: 18  SpO2: 98%    I personally  performed the services described in this documentation, which was scribed in my presence. The recorded information has been reviewed and is accurate.  Patient presenting to emergency department with chest pain that has been ongoing since Tuesday. Patient reports the chest pain is localized to the Center of his chest with radiation towards the back described as a tightness, gnawing sensation. Patient reported that the chest pain occurs mainly after eating. Patient reports that he eats a lot of spicy food-[place] food-and a lot of oily greasy food. Stated that he eats meals late at night. Reports the pain worsens when lying down. This provider reviewed patient's chart. Patient was seen and assessed on 08/24/2013 regarding chest pain with negative findings on EKG, 2 troponins with negative elevation, chest x-ray was unremarkable. Patient given GI cocktail with a positive response. Patient was discharged with Pepcid-reported he has not filled the prescription. Patient was seen and assessed in [place] today where labs were drawn-CBC negative findings, and BMP negative findings, troponin negative elevation. Alert and oriented. GCS 15. Heart rate and rhythm normal. Lungs clear to auscultation. Radial pulses 2+ bilaterally. Negative pain  upon palpation to the chest wall. Full range of motion to upper and lower extremities bilaterally without difficulty noted or ataxia. Oral exam unremarkable. EKG noted normal sinus rhythm with a heart rate of 59 beats per minute-negative ischemic findings, negative acute findings. Troponin negative elevation. Troponin that was taken at 1:49 PM was negative. Troponin been taking over the past 2 days have been negative elevation. Chest x-ray negative acute cardiac pulmonary disease noted-performed on 08/24/2013. PERC score 0 - doubt PE. HEART score 1. Doubt ACS. Doubt AAA. Doubt PTX. Doubt pneumonia. Suspicion to be GERD. Patient responded well to GI cocktail. Patient stable, afebrile. Mild elevation in blood pressure - patient reported that he used to take medication for blood pressure, but he does not anymore, patient is unable to recall the medication. Discussed with patient to take Pepcid that he was discharged with 2 days ago. Discussed with patient proper GERD diet. Discussed DASH diet. Discussed with patient to rest and stay hydrated. Referred to Urgent care, GI, and cardiology. Discussed with patient for blood pressure to be re-checked at Urgent Care within a couple of days. Discussed with patient to closely monitor symptoms and if symptoms are to worsen or change to report back to the ED - strict return instructions given.  Patient agreed to plan of care, understood, all questions answered.   Raymon Mutton, PA-C 08/27/13 1517

## 2013-08-26 NOTE — ED Notes (Signed)
Unable to locate patient x3.

## 2013-08-26 NOTE — ED Notes (Signed)
Per pt, was seen [facility] today for chest pain and also  2 days ago.  Pt states given pepcid but did not fill script.  Left AMA at [facility] d/t wait times.  Pt states pain better but wants rechecked to confirm dx.

## 2013-08-26 NOTE — ED Notes (Signed)
Pt was tx here Thursday for chest pain and told he had acid reflux.  When he awoke this am he felt tightness in the middle of his chest - pain eases when he stands.

## 2013-08-26 NOTE — Discharge Instructions (Signed)
Please call and set-up an appointment with Urgent Care center to get blood pressure re-checked. Please continue to stay hydrated and avoid foods high in fat. Below is a DASH diet to follow to control blood pressure. Please take medications as prescribed - Hydrochlorothiazide.  Please rest and stay hydrated Please fill out prescriptions for Pepcid and take the medications as prescribed Please drink plenty of water Please avoid spicy foods, please do not eat late at night. Please allow at least 1 hour after each meal before laying down.  Please call and set-up an appointment with GI and cardiology Please continue to monitor symptoms closely and if symptoms are to worsen or change (fever greater than 101, chills, sweating, neck pain, neck stiffness, chest pain changes or worsens, shortness of breath, difficulty breathing, coughing up or vomiting up blood) please report back to the ED immediately  Diet for Gastroesophageal Reflux Disease, Adult Reflux (acid reflux) is when acid from your stomach flows up into the esophagus. When acid comes in contact with the esophagus, the acid causes irritation and soreness (inflammation) in the esophagus. When reflux happens often or so severely that it causes damage to the esophagus, it is called gastroesophageal reflux disease (GERD). Nutrition therapy can help ease the discomfort of GERD. FOODS OR DRINKS TO AVOID OR LIMIT  Smoking or chewing tobacco. Nicotine is one of the most potent stimulants to acid production in the gastrointestinal tract.  Caffeinated and decaffeinated coffee and black tea.  Regular or low-calorie carbonated beverages or energy drinks (caffeine-free carbonated beverages are allowed).   Strong spices, such as black pepper, white pepper, red pepper, cayenne, curry powder, and chili powder.  Peppermint or spearmint.  Chocolate.  High-fat foods, including meats and fried foods. Extra added fats including oils, butter, salad dressings,  and nuts. Limit these to less than 8 tsp per day.  Fruits and vegetables if they are not tolerated, such as citrus fruits or tomatoes.  Alcohol.  Any food that seems to aggravate your condition. If you have questions regarding your diet, call your caregiver or a registered dietitian. OTHER THINGS THAT MAY HELP GERD INCLUDE:   Eating your meals slowly, in a relaxed setting.  Eating 5 to 6 small meals per day instead of 3 large meals.  Eliminating food for a period of time if it causes distress.  Not lying down until 3 hours after eating a meal.  Keeping the head of your bed raised 6 to 9 inches (15 to 23 cm) by using a foam wedge or blocks under the legs of the bed. Lying flat may make symptoms worse.  Being physically active. Weight loss may be helpful in reducing reflux in overweight or obese adults.  Wear loose fitting clothing EXAMPLE MEAL PLAN This meal plan is approximately 2,000 calories based on https://www.bernard.org/ChooseMyPlate.gov meal planning guidelines. Breakfast   cup cooked oatmeal.  1 cup strawberries.  1 cup low-fat milk.  1 oz almonds. Snack  1 cup cucumber slices.  6 oz yogurt (made from low-fat or fat-free milk). Lunch  2 slice whole-wheat bread.  2 oz sliced [place]urkey.  2 tsp mayonnaise.  1 cup blueberries.  1 cup snap peas. Snack  6 whole-wheat crackers.  1 oz string cheese. Dinner   cup brown rice.  1 cup mixed veggies.  1 tsp olive oil.  3 oz grilled fish. Document Released: 07/08/2005 Document Revised: 09/30/2011 Document Reviewed: 05/24/2011 [facility] Patient Information 2014 [place], [place].  Gastroesophageal Reflux Disease, Adult Gastroesophageal reflux disease (GERD) happens  when acid from your stomach flows up into the esophagus. When acid comes in contact with the esophagus, the acid causes soreness (inflammation) in the esophagus. Over time, GERD may create small holes (ulcers) in the lining of the esophagus. CAUSES   Increased body  weight. This puts pressure on the stomach, making acid rise from the stomach into the esophagus.  Smoking. This increases acid production in the stomach.  Drinking alcohol. This causes decreased pressure in the lower esophageal sphincter (valve or ring of muscle between the esophagus and stomach), allowing acid from the stomach into the esophagus.  Late evening meals and a full stomach. This increases pressure and acid production in the stomach.  A malformed lower esophageal sphincter. Sometimes, no cause is found. SYMPTOMS   Burning pain in the lower part of the mid-chest behind the breastbone and in the mid-stomach area. This may occur twice a week or more often.  Trouble swallowing.  Sore throat.  Dry cough.  Asthma-like symptoms including chest tightness, shortness of breath, or wheezing. DIAGNOSIS  Your caregiver may be able to diagnose GERD based on your symptoms. In some cases, X-rays and other tests may be done to check for complications or to check the condition of your stomach and esophagus. TREATMENT  Your caregiver may recommend over-the-counter or prescription medicines to help decrease acid production. Ask your caregiver before starting or adding any new medicines.  HOME CARE INSTRUCTIONS   Change the factors that you can control. Ask your caregiver for guidance concerning weight loss, quitting smoking, and alcohol consumption.  Avoid foods and drinks that make your symptoms worse, such as:  Caffeine or alcoholic drinks.  Chocolate.  Peppermint or mint flavorings.  Garlic and onions.  Spicy foods.  Citrus fruits, such as oranges, lemons, or limes.  Tomato-based foods such as sauce, chili, salsa, and pizza.  Fried and fatty foods.  Avoid lying down for the 3 hours prior to your bedtime or prior to taking a nap.  Eat small, frequent meals instead of large meals.  Wear loose-fitting clothing. Do not wear anything tight around your waist that causes  pressure on your stomach.  Raise the head of your bed 6 to 8 inches with wood blocks to help you sleep. Extra pillows will not help.  Only take over-the-counter or prescription medicines for pain, discomfort, or fever as directed by your caregiver.  Do not take aspirin, ibuprofen, or other nonsteroidal anti-inflammatory drugs (NSAIDs). SEEK IMMEDIATE MEDICAL CARE IF:   You have pain in your arms, neck, jaw, teeth, or back.  Your pain increases or changes in intensity or duration.  You develop nausea, vomiting, or sweating (diaphoresis).  You develop shortness of breath, or you faint.  Your vomit is green, yellow, black, or looks like coffee grounds or blood.  Your stool is red, bloody, or black. These symptoms could be signs of other problems, such as heart disease, gastric bleeding, or esophageal bleeding. MAKE SURE YOU:   Understand these instructions.  Will watch your condition.  Will get help right away if you are not doing well or get worse. Document Released: 04/17/2005 Document Revised: 09/30/2011 Document Reviewed: 01/25/2011 [facility] Patient Information 2014 [place], [place].  DASH Diet The DASH diet stands for "Dietary Approaches to Stop Hypertension." It is a healthy eating plan that has been shown to reduce high blood pressure (hypertension) in as little as 14 days, while also possibly providing other significant health benefits. These other health benefits include reducing the risk of breast  cancer after menopause and reducing the risk of type 2 diabetes, heart disease, colon cancer, and stroke. Health benefits also include weight loss and slowing kidney failure in patients with chronic kidney disease.  DIET GUIDELINES  Limit salt (sodium). Your diet should contain less than 1500 mg of sodium daily.  Limit refined or processed carbohydrates. Your diet should include mostly whole grains. Desserts and added sugars should be used sparingly.  Include small amounts of  heart-healthy fats. These types of fats include nuts, oils, and tub margarine. Limit saturated and trans fats. These fats have been shown to be harmful in the body. CHOOSING FOODS  The following food groups are based on a 2000 calorie diet. See your Registered Dietitian for individual calorie needs. Grains and Grain Products (6 to 8 servings daily)  Eat More Often: Whole-wheat bread, brown rice, whole-grain or wheat pasta, quinoa, popcorn without added fat or salt (air popped).  Eat Less Often: White bread, white pasta, white rice, cornbread. Vegetables (4 to 5 servings daily)  Eat More Often: Fresh, frozen, and canned vegetables. Vegetables may be raw, steamed, roasted, or grilled with a minimal amount of fat.  Eat Less Often/Avoid: Creamed or fried vegetables. Vegetables in a cheese sauce. Fruit (4 to 5 servings daily)  Eat More Often: All fresh, canned (in natural juice), or frozen fruits. Dried fruits without added sugar. One hundred percent fruit juice ( cup [237 mL] daily).  Eat Less Often: Dried fruits with added sugar. Canned fruit in light or heavy syrup. [employer], Fish, and Poultry (2 servings or less daily. One serving is 3 to 4 oz [85-114 g]).  Eat More Often: Ninety percent or leaner ground beef, tenderloin, sirloin. Round cuts of beef, chicken breast, [place] breast. All fish. Grill, bake, or broil your meat. Nothing should be fried.  Eat Less Often/Avoid: Fatty cuts of meat, [place], or chicken leg, thigh, or wing. Fried cuts of meat or fish. Dairy (2 to 3 servings)  Eat More Often: Low-fat or fat-free milk, low-fat plain or light yogurt, reduced-fat or part-skim cheese.  Eat Less Often/Avoid: Milk (whole, 2%).Whole milk yogurt. Full-fat cheeses. Nuts, Seeds, and Legumes (4 to 5 servings per week)  Eat More Often: All without added salt.  Eat Less Often/Avoid: Salted nuts and seeds, canned beans with added salt. Fats and Sweets (limited)  Eat More Often: Vegetable  oils, tub margarines without trans fats, sugar-free gelatin. Mayonnaise and salad dressings.  Eat Less Often/Avoid: Coconut oils, palm oils, butter, stick margarine, cream, half and half, cookies, candy, pie. FOR MORE INFORMATION The Dash Diet Eating Plan: www.dashdiet.org Document Released: 06/27/2011 Document Revised: 09/30/2011 Document Reviewed: 06/27/2011 [facility] Patient Information 2014 [place], [place].  Hypertension As your heart beats, it forces blood through your arteries. This force is your blood pressure. If the pressure is too high, it is called hypertension (HTN) or high blood pressure. HTN is dangerous because you may have it and not know it. High blood pressure may mean that your heart has to work harder to pump blood. Your arteries may be narrow or stiff. The extra work puts you at risk for heart disease, stroke, and other problems.  Blood pressure consists of two numbers, a higher number over a lower, 110/72, for example. It is stated as "110 over 72." The ideal is below 120 for the top number (systolic) and under 80 for the bottom (diastolic). Write down your blood pressure today. You should pay close attention to your blood pressure if you have  certain conditions such as:  Heart failure.  Prior heart attack.  Diabetes  Chronic kidney disease.  Prior stroke.  Multiple risk factors for heart disease. To see if you have HTN, your blood pressure should be measured while you are seated with your arm held at the level of the heart. It should be measured at least twice. A one-time elevated blood pressure reading (especially in the Emergency Department) does not mean that you need treatment. There may be conditions in which the blood pressure is different between your right and left arms. It is important to see your caregiver soon for a recheck. Most people have essential hypertension which means that there is not a specific cause. This type of high blood pressure may be lowered  by changing lifestyle factors such as:  Stress.  Smoking.  Lack of exercise.  Excessive weight.  Drug/tobacco/alcohol use.  Eating less salt. Most people do not have symptoms from high blood pressure until it has caused damage to the body. Effective treatment can often prevent, delay or reduce that damage. TREATMENT  When a cause has been identified, treatment for high blood pressure is directed at the cause. There are a large number of medications to treat HTN. These fall into several categories, and your caregiver will help you select the medicines that are best for you. Medications may have side effects. You should review side effects with your caregiver. If your blood pressure stays high after you have made lifestyle changes or started on medicines,   Your medication(s) may need to be changed.  Other problems may need to be addressed.  Be certain you understand your prescriptions, and know how and when to take your medicine.  Be sure to follow up with your caregiver within the time frame advised (usually within two weeks) to have your blood pressure rechecked and to review your medications.  If you are taking more than one medicine to lower your blood pressure, make sure you know how and at what times they should be taken. Taking two medicines at the same time can result in blood pressure that is too low. SEEK IMMEDIATE MEDICAL CARE IF:  You develop a severe headache, blurred or changing vision, or confusion.  You have unusual weakness or numbness, or a faint feeling.  You have severe chest or abdominal pain, vomiting, or breathing problems. MAKE SURE YOU:   Understand these instructions.  Will watch your condition.  Will get help right away if you are not doing well or get worse. Document Released: 07/08/2005 Document Revised: 09/30/2011 Document Reviewed: 02/26/2008 [facility] Patient Information 2014 [place], [place].   Emergency Department Resource Guide 1) Find a  Doctor and Pay Out of Pocket Although you won't have to find out who is covered by your insurance plan, it is a good idea to ask around and get recommendations. You will then need to call the office and see if the doctor you have chosen will accept you as a new patient and what types of options they offer for patients who are self-pay. Some doctors offer discounts or will set up payment plans for their patients who do not have insurance, but you will need to ask so you aren't surprised when you get to your appointment.  2) Contact Your Local Health Department Not all health departments have doctors that can see patients for sick visits, but many do, so it is worth a call to see if yours does. If you don't know where your local health department is,  you can check in your phone book. The CDC also has a tool to help you locate your state's health department, and many state websites also have listings of all of their local health departments.  3) Find a Walk-in Clinic If your illness is not likely to be very severe or complicated, you may want to try a walk in clinic. These are popping up all over the country in pharmacies, drugstores, and shopping centers. They're usually staffed by nurse practitioners or physician assistants that have been trained to treat common illnesses and complaints. They're usually fairly quick and inexpensive. However, if you have serious medical issues or chronic medical problems, these are probably not your best option.  No Primary Care Doctor: - Call Health Connect at  [phone] - they can help you locate a primary care doctor that  accepts your insurance, provides certain services, etc. - Physician Referral Service- [phone]  Chronic Pain Problems: Organization         Address  Phone   Notes  [facility]  [phone] Patients need to be referred by their primary care doctor.   Medication Assistance: Organization         Address  Phone    Notes  [facility] Medication [facility] [address] [place] [place], [place] [address] [phone] --Must be a resident of [facility] -- Must have NO insurance coverage whatsoever (no Medicaid/ Medicare, etc.) -- The pt. MUST have a primary care doctor that directs their care regularly and follows them in the community   [facility]  [phone]   [employer]  [phone]    Agencies that provide inexpensive medical care: Organization         Address  Phone   Notes  [facility]  [phone]   [facility]    [phone]   [facility] [address] [place], [place] [address] [phone]   [facility] [address] [place] [place] [phone]   Planned Parenthood    [phone]   [facility]    [phone]   Community Health and [facility]  [address], [place] Phone:  [phone], Fax:  [phone] Hours of Operation:  9 am - 6 pm, M-F.  Also accepts Medicaid/Medicare and self-pay.  [facility]  [address], [place] Phone: [phone], Fax: [phone]. Hours of Operation:  8:30 am - 5:30 pm, M-F.  Also accepts Medicaid and self-pay.  [facility] [place] [address], [place] Phone: [phone]   [facility] [address] Natasha Bence [place], [place] [phone], Ext. [phone] Mondays & Thursdays: 7-9 AM.  First 15 patients are seen on a first come, first serve basis.    Medicaid-accepting [facility] Providers:  Organization         Address  Phone   Notes  [facility] [address], [place] [phone] Also accepts self-pay patients.  [facility] [address] Laurell Josephs [place], [place]  [phone]   [facility] [address], [place] [phone]   [facility] Family  Medicine [address], [place] [phone]   Renaye Rakers [address], [place]   [phone] Only accepts [place] patients after they have their name  applied to their card.   Self-Pay (no insurance) in [facility]:  Organization         Address  Phone   Notes  Sickle Cell Patients, [facility] Internal Medicine [address] [place], [place] [phone]   [facility] [address] [place], [place] [phone]   [facility] [place]  [address], [place] [phone]   [facility]/Dr. Osei-Bonsu  [address] [place] or [address], [place] [phone] Phone number for both [place] and [place] locations is the same.  [facility] [address] [place] [phone]   [facility] [address], [place] or [address] [phone]   [facility] [address], [place] [phone], phone; [phone], fax Sees patients 1st and 3rd Saturday of every month.  Must not qualify for public or private insurance (i.e. Medicaid, Medicare, [place] Health Choice, Veterans' Benefits)  Household income should be no more than 200% of the poverty level The clinic cannot treat you if you are pregnant or think you are pregnant  Sexually transmitted diseases are not treated at the clinic.    Dental Care: Organization         Address  Phone  Notes  [facility] [place] Department of [facility] [address] [place], [place] [phone] Accepts children up to age 101 who are enrolled in [place] or [place] Health Choice; pregnant women with a Medicaid card; and children who have applied for Medicaid or [place] Health Choice, but were declined, whose parents can pay a reduced fee at time of service.  [facility] Department of [facility]  [address], [place] [phone] Accepts children up to age 55 who are enrolled in [place] or [place] Health Choice; pregnant women with a Medicaid card; and children who have applied for Medicaid or [place] Health Choice, but were declined, whose parents can pay a reduced fee at time of service.  [facility]  [address] [place], [place] [phone] Patients are seen by appointment only. Walk-ins are not accepted. [facility] will see patients 12 years of age and older. Monday - Tuesday (8am-5pm) Most Wednesdays (8:30-5pm) $30 per visit, cash only  [facility] Adult Dental Access [facility]  [address], [facility] [phone] Patients are seen by appointment only. Walk-ins are not accepted. [facility] will see patients 19 years of age and older. One Wednesday Evening (Monthly: Volunteer Based).  $30 per visit, cash only  [employer]  [phone] for adults; Children under age 15, call Graduate Pediatric Dentistry at [phone]. Children aged 50-14, please call [phone] to request a pediatric application.  Dental services are provided in all areas of dental care including fillings, crowns and bridges, complete and partial dentures, implants, gum treatment, root canals, and extractions. Preventive care is also provided. Treatment is provided to both adults and children. Patients are selected via a lottery and there is often a waiting list.   [facility] [address] [place]  [phone] [email or web address]   [facility] [address] [place], [place] [phone], Ext. [phone] Second and Fourth Thursday of each month, opens at 6:30 AM; Clinic ends at 9 AM.  Patients are seen on a first-come first-served  basis, and a limited number are seen during each clinic.   [facility]  [address] Ether Griffins [place], [place] [phone]   Eligibility Requirements You must have lived in  [place], [place], or [place] counties for at least the last three months.   You cannot be eligible for state or federal sponsored [place], including [employer], [place], or [employer].   You generally cannot be eligible for healthcare insurance through your employer.    How to apply: Eligibility screenings are held every Tuesday and Wednesday afternoon from 1:00 pm until 4:00 pm. You do not need an appointment for the interview!  [facility] [address], [place], [place] [phone]   [facility] Health Department  [phone]   [facility]  [phone]   [facility] Health Department  [phone]    Behavioral Health Resources in the Community: Intensive Outpatient Programs Organization         Address  Phone  Notes  [facility] Services [address], [place], [place] [phone]   [facility] Outpatient [address] [place], [place] [phone]   [facility] [address] [place], [place]  [phone]   [facility] Mental Health [address]  [place], [place] [phone] or [phone]   Substance Abuse Resources Organization         Address  Phone  Notes  [facility]  [phone]   [facility]  [phone]   [facility] [place] [facility]  [phone]   Floydene Flock  [phone]   Residential & Outpatient Substance Abuse Program  [phone]   Psychological Services Organization         Address  Phone  Notes  [facility] Behavioral Health  [phone]   [facility] Services  [phone]   [facility] Mental Health [address], [place] [phone] or [phone]    Mobile Crisis Teams Organization         Address  Phone  Notes  [facility], Mobile Crisis Care Unit  [phone]   [facility]  [address] [place], [place] [phone]   Doristine Locks [address] [address] [place] [phone]    Self-Help/Support Groups Organization         Address  Phone             Notes  [facility] [place] - variety of support groups  336- [phone] Call for more information  Narcotics Anonymous (NA), [facility] [address], [employer] [place]  2 meetings at this location   Statistician         Address  Phone  Notes  [facility] [address],    [place]  [phone]   [facility]  [address], [place] [address], [place], [place] [phone]   [facility] [address] [place], [place] [phone] Admissions: 8am-3pm M-F  [facility] [address],    [place], [place] [phone]   [facility] [address] Starling Manns [place], [place] [phone]   [facility] [address]  [place], [place] [phone]   [facility] Intensive Outpatient [address], [place], [place] [phone]   [facility] ([facility] [address]  [place], [place] [phone] or [phone]   [facility] [address] [place], [place] [phone] Accepts Medicaid  [facility] [address],  [place] [phone] Substance Abuse/Addiction Treatment   [facility] Organization         Address  Phone  Notes  [facility]  [phone]   Angie Fava, PhD [address] Ervin Knack [place], [place]   [phone] or [phone]   [facility]   [address] [place], [place] [phone]   [facility] [address] [place], [place] [phone] Insurance/Medicaid/sponsorship through [facility] [address]                                    [place], [place] [phone] Therapy/tele-psych/case  [facility] [address], [place] [phone]    Dr. Lolly Mustache  [phone]   [facility]  [employer] [facility] Dept. 1) [address], [place] 2) [address] [place] 3)  [address], [place] [phone]  [phone]   [facility] Child Abuse Hotline [phone]) [phone] or [phone] (After Hours)

## 2013-08-28 NOTE — ED Provider Notes (Signed)
Medical screening examination/treatment/procedure(s) were performed by non-physician practitioner and as supervising physician I was immediately available for consultation/collaboration.  Flint MelterElliott L Tenae Graziosi, MD 08/28/13 [phone]

## 2014-02-04 ENCOUNTER — Encounter (HOSPITAL_COMMUNITY): Payer: Self-pay | Admitting: Emergency Medicine

## 2014-02-04 ENCOUNTER — Emergency Department (HOSPITAL_COMMUNITY)
Admission: EM | Admit: 2014-02-04 | Discharge: 2014-02-04 | Disposition: A | Discharge status: Home / Self Care | Payer: Self-pay | Attending: Emergency Medicine | Admitting: Emergency Medicine

## 2014-02-04 DIAGNOSIS — Z79899 Other long term (current) drug therapy: Secondary | ICD-10-CM | POA: Insufficient documentation

## 2014-02-04 DIAGNOSIS — F17200 Nicotine dependence, unspecified, uncomplicated: Secondary | ICD-10-CM | POA: Insufficient documentation

## 2014-02-04 DIAGNOSIS — G8929 Other chronic pain: Secondary | ICD-10-CM | POA: Insufficient documentation

## 2014-02-04 DIAGNOSIS — I10 Essential (primary) hypertension: Secondary | ICD-10-CM | POA: Insufficient documentation

## 2014-02-04 DIAGNOSIS — R109 Unspecified abdominal pain: Secondary | ICD-10-CM | POA: Insufficient documentation

## 2014-02-04 DIAGNOSIS — M25569 Pain in unspecified knee: Secondary | ICD-10-CM | POA: Insufficient documentation

## 2014-02-04 DIAGNOSIS — R112 Nausea with vomiting, unspecified: Secondary | ICD-10-CM | POA: Insufficient documentation

## 2014-02-04 LAB — URINALYSIS, ROUTINE W REFLEX MICROSCOPIC
Glucose, UA: NEGATIVE mg/dL
Ketones, ur: NEGATIVE mg/dL
Nitrite: NEGATIVE
Protein, ur: NEGATIVE mg/dL
Specific Gravity, Urine: 1.03 (ref 1.005–1.030)
Urobilinogen, UA: 1 mg/dL (ref 0.0–1.0)
pH: 6 (ref 5.0–8.0)

## 2014-02-04 LAB — URINE MICROSCOPIC-ADD ON

## 2014-02-04 LAB — COMPREHENSIVE METABOLIC PANEL
ALT: 16 U/L (ref 0–53)
AST: 35 U/L (ref 0–37)
Albumin: 4.5 g/dL (ref 3.5–5.2)
Alkaline Phosphatase: 86 U/L (ref 39–117)
Anion gap: 15 (ref 5–15)
BUN: 12 mg/dL (ref 6–23)
CO2: 25 mEq/L (ref 19–32)
Calcium: 10.1 mg/dL (ref 8.4–10.5)
Chloride: 100 mEq/L (ref 96–112)
Creatinine, Ser: 1.18 mg/dL (ref 0.50–1.35)
GFR calc Af Amer: 90 mL/min — ABNORMAL LOW (ref >90)
GFR calc non Af Amer: 77 mL/min — ABNORMAL LOW (ref >90)
Glucose, Bld: 97 mg/dL (ref 70–99)
Potassium: 3.5 mEq/L — ABNORMAL LOW (ref 3.7–5.3)
Sodium: 140 mEq/L (ref 137–147)
Total Bilirubin: 0.5 mg/dL (ref 0.3–1.2)
Total Protein: 8.2 g/dL (ref 6.0–8.3)

## 2014-02-04 LAB — CBC WITH DIFFERENTIAL/PLATELET
Basophils Absolute: 0 10*3/uL (ref 0.0–0.1)
Basophils Relative: 0 % (ref 0–1)
Eosinophils Absolute: 0.1 10*3/uL (ref 0.0–0.7)
Eosinophils Relative: 1 % (ref 0–5)
HCT: 46.4 % (ref 39.0–52.0)
Hemoglobin: 16.4 g/dL (ref 13.0–17.0)
Lymphocytes Relative: 33 % (ref 12–46)
Lymphs Abs: 3.9 10*3/uL (ref 0.7–4.0)
MCH: 31.1 pg (ref 26.0–34.0)
MCHC: 35.3 g/dL (ref 30.0–36.0)
MCV: 88 fL (ref 78.0–100.0)
Monocytes Absolute: 1 10*3/uL (ref 0.1–1.0)
Monocytes Relative: 8 % (ref 3–12)
Neutro Abs: 6.8 10*3/uL (ref 1.7–7.7)
Neutrophils Relative %: 58 % (ref 43–77)
Platelets: 188 10*3/uL (ref 150–400)
RBC: 5.27 MIL/uL (ref 4.22–5.81)
RDW: 13.3 % (ref 11.5–15.5)
WBC: 11.9 10*3/uL — ABNORMAL HIGH (ref 4.0–10.5)

## 2014-02-04 MED ORDER — ONDANSETRON HCL 4 MG PO TABS: 4.0000 mg | ORAL_TABLET | Freq: Three times a day (TID) | ORAL | Status: DC | PRN

## 2014-02-04 MED ORDER — FAMOTIDINE 20 MG PO TABS
20.0000 mg | ORAL_TABLET | Freq: Once | ORAL | Status: AC
  Administered 2014-02-04: 20 mg via ORAL
  Filled 2014-02-04: qty 1

## 2014-02-04 MED ORDER — FAMOTIDINE 20 MG PO TABS: 20.0000 mg | ORAL_TABLET | Freq: Two times a day (BID) | ORAL | Status: DC

## 2014-02-04 MED ORDER — ONDANSETRON 4 MG PO TBDP
4.0000 mg | ORAL_TABLET | Freq: Once | ORAL | Status: AC
  Administered 2014-02-04: 4 mg via ORAL
  Filled 2014-02-04: qty 1

## 2014-02-04 NOTE — ED Provider Notes (Signed)
CSN: [id number]     Arrival date & time 02/04/14  1329 History   First MD Initiated Contact with Patient 02/04/14 1457     Chief Complaint  Patient presents with  . Abdominal Pain     (Consider location/radiation/quality/duration/timing/severity/associated sxs/prior Treatment) HPI  37yM with abdominal pain and n/v. Onset last night around 2200 while at bowling alley. Was not drinking. "It just hurts." Pain is diffuse, but worse in upper abdomen. Has waxen and waned. Nausea and has vomited. "It looked straight black." No blood thinners. Nausea and pain worse "as soon as I tried eating" this morning. Reports history of reflux and previously on pepcid. No BRBPR or melena. No dizziness, lightheadedness or SOB.   Past Medical History  Diagnosis Date  . Chronic knee pain   . Hypertension    History reviewed. No pertinent past surgical history. History reviewed. No pertinent family history. History  Substance Use Topics  . Smoking status: Current Every Day Smoker -- 0.25 packs/day    Types: Cigarettes  . Smokeless tobacco: Not on file  . Alcohol Use: Yes     Comment: occ    Review of Systems  All systems reviewed and negative, other than as noted in HPI.   Allergies  Review of patient's allergies indicates no known allergies.  Home Medications   Prior to Admission medications   Medication Sig Start Date End Date Taking? Authorizing Provider  famotidine (PEPCID) 20 MG tablet Take 20 mg by mouth 2 (two) times daily as needed for heartburn or indigestion.   Yes Historical Provider, MD  famotidine (PEPCID) 20 MG tablet Take 1 tablet (20 mg total) by mouth 2 (two) times daily. 02/04/14   Raeford RazorStephen Maciej Schweitzer, MD  ondansetron (ZOFRAN) 4 MG tablet Take 1 tablet (4 mg total) by mouth every 8 (eight) hours as needed for nausea or vomiting. 02/04/14   Raeford RazorStephen Daivd Fredericksen, MD   BP 145/103  Pulse 59  Temp(Src) 97.7 F (36.5 C) (Oral)  Resp 19  SpO2 95% Physical Exam  Nursing note and vitals  reviewed. Constitutional: He appears well-developed and well-nourished. No distress.  Sitting on edge of bed watching tv. NAD.   HENT:  Head: Normocephalic and atraumatic.  Eyes: Conjunctivae are normal. Right eye exhibits no discharge. Left eye exhibits no discharge.  Neck: Neck supple.  Cardiovascular: Normal rate, regular rhythm and normal heart sounds.  Exam reveals no gallop and no friction rub.   No murmur heard. Pulmonary/Chest: Effort normal and breath sounds normal. No respiratory distress.  Abdominal: Soft. He exhibits no distension. There is no tenderness.  Genitourinary:  No cva tenderness.   Musculoskeletal: He exhibits no edema and no tenderness.  Neurological: He is alert.  Skin: Skin is warm and dry.  Psychiatric: He has a normal mood and affect. His behavior is normal. Thought content normal.    ED Course  Procedures (including critical care time) Labs Review Labs Reviewed  CBC WITH DIFFERENTIAL - Abnormal; Notable for the following:    WBC 11.9 (*)    All other components within normal limits  COMPREHENSIVE METABOLIC PANEL - Abnormal; Notable for the following:    Potassium 3.5 (*)    GFR calc non Af Amer 77 (*)    GFR calc Af Amer 90 (*)    All other components within normal limits  URINALYSIS, ROUTINE W REFLEX MICROSCOPIC - Abnormal; Notable for the following:    Color, Urine AMBER (*)    Hgb urine dipstick MODERATE (*)  Bilirubin Urine SMALL (*)    Leukocytes, UA TRACE (*)    All other components within normal limits  URINE MICROSCOPIC-ADD ON    Imaging Review No results found.   EKG Interpretation None      MDM   Final diagnoses:  Abdominal pain, unspecified abdominal location  Non-intractable vomiting with nausea, vomiting of unspecified type    37yM with abdominal pain and n/v. Exam benign. HTN noted, but has hx of same and not taking meds like supposed to. Can't remember what was previously on. Given resources to establish primary  care. Labs unremarkable. Suspect gastritis or PUD. Encouraged to quit smoking and moderate ETOH. Similar previous symptoms and did well on pepcid. Additionally zofran PRN nausea. Return precautions discussed. Doubt significant GI bleed.     Raeford Razor, MD 02/04/14 1539

## 2014-02-04 NOTE — ED Notes (Signed)
Pt has been vomiting since 10 pm last night with abdominal pain.  Pt states felt fine prior.  Did normal workout at the gym and after returning home became nauseated.  Pt states no bulging abdominal muscle from workout.  Does upper chest arms only.  No bowel/bladder symptoms.  No fever.

## 2014-02-04 NOTE — Discharge Instructions (Signed)
°Emergency Department Resource Guide °1) Find a Doctor and Pay Out of Pocket °Although you won't have to find out who is covered by your insurance plan, it is a good idea to ask around and get recommendations. You will then need to call the office and see if the doctor you have chosen will accept you as a new patient and what types of options they offer for patients who are self-pay. Some doctors offer discounts or will set up payment plans for their patients who do not have insurance, but you will need to ask so you aren't surprised when you get to your appointment. ° °2) Contact Your Local Health Department °Not all health departments have doctors that can see patients for sick visits, but many do, so it is worth a call to see if yours does. If you don't know where your local health department is, you can check in your phone book. The CDC also has a tool to help you locate your state's health department, and many state websites also have listings of all of their local health departments. ° °3) Find a Walk-in Clinic °If your illness is not likely to be very severe or complicated, you may want to try a walk in clinic. These are popping up all over the country in pharmacies, drugstores, and shopping centers. They're usually staffed by nurse practitioners or physician assistants that have been trained to treat common illnesses and complaints. They're usually fairly quick and inexpensive. However, if you have serious medical issues or chronic medical problems, these are probably not your best option. ° °No Primary Care Doctor: °- Call Health Connect at  [phone] - they can help you locate a primary care doctor that  accepts your insurance, provides certain services, etc. °- Physician Referral Service- 1-800-533-3463 ° °Chronic Pain Problems: °Organization         Address  Phone   Notes  °[facility]  ([phone]) [phone] Patients need to be referred by their primary care doctor.  ° °Medication  Assistance: °Organization         Address  Phone   Notes  °[place] [facility] [address] °[place], [place] [address] (336) 641-8030 --Must be a resident of [place] °-- Must have NO insurance coverage whatsoever (no Medicaid/ Medicare, etc.) °-- The pt. MUST have a primary care doctor that directs their care regularly and follows them in the community °  °[facility]  (866) 331-1348   °[employer] Way  (888) 892-1162   ° °Agencies that provide inexpensive medical care: °Organization         Address  Phone   Notes  °[facility]  (336) [phone]   °[facility]    (336) 832-7272   °[facility] [address] °[place], [place] [address] (336) 832-4777   °[facility] [address], °[place] (336) 271-4999   °[facility]    (336) 373-0678   °[facility]    (336) 272-1050   °[facility] ° [address], [place] Phone:  (336) 832-4444, Fax:  (336) [phone] Hours of Operation:  9 am - 6 pm, M-F.  Also accepts Medicaid/Medicare and self-pay.  °[facility] ° [address], [place] Phone: (336) [phone], Fax: (336) [phone]. Hours of Operation:  8:30 am - 5:30 pm, M-F.  Also accepts Medicaid and self-pay.  °[facility] [place] [address], [place] Phone: (336) [phone]   °[facility] [address], [place], [place] (336)723-1848, Ext. 123 Mondays & Thursdays: 7-9 AM.  First 15 patients are seen on a first come, first serve basis. °  ° °Medicaid-accepting [place] Providers: ° °Organization         Address  Phone   Notes  °[facility] [address], [place] ([phone]) [phone] Also accepts self-pay patients.  °[facility] [address], [place] ° (336) [phone]   °[facility] [address], [place]  (336) [phone]   °[facility] [address] [place] [address], [place] (336) 299-7000   °Veita Bland [address], [place]  ° (336) [phone] Only accepts [place] Access Medicaid patients after they have their name applied to their card.  ° °Self-Pay (no insurance) in [place]: ° °Organization         Address  Phone   Notes  °Sickle Cell Patients, [facility] [address], [place] (336) 832-1970   °[facility] [address], [place] (336) 832-4400   °[facility] ° [address], [place] (336) 992-4800   °[facility]/Dr. Osei-Bonsu ° [address] [place] [address], [place] or [address], [place] (336) [phone] Phone number for both [place] and [place] locations is the same.  °[facility] [address], [place] (336) 299-0000   °[facility] [address] [place] [address], [place] or [address] (336) 852-7530 °(336) 878-2260   °[facility] [address], [place] (336) 350-1642, phone; (336) 294-5005, fax Sees patients 1st and 3rd Saturday of every month.  Must not qualify for public or private insurance (i.e. Medicaid, Medicare, [place] Health Choice, Veterans' Benefits) • Household income should be no more than 200% of the poverty level •The clinic cannot treat you if you are pregnant or think you are pregnant • Sexually transmitted diseases are not treated at the clinic.  ° ° °Dental Care: °Organization         Address  Phone  Notes  °[place] [facility] [address], [place] (336) 641-6152 Accepts children up to age 21 who are enrolled in Medicaid or [place] Health Choice; pregnant women with a Medicaid card; and children who have applied for Medicaid or [place] Health Choice, but were declined, whose parents can pay a reduced fee at time of service.  °[place]  [facility] [place]  [address], [place] (336) 641-7733 Accepts children up to age 21 who are enrolled in Medicaid or [place] Health Choice; pregnant women with a Medicaid card; and children who have applied for Medicaid or [place] Health Choice, but were declined, whose parents can pay a reduced fee at time of service.  °[facility] ° [address], [place] (336) 641-4533 Patients are seen by appointment only. Walk-ins are not accepted. [facility] will see patients 18 years of age and older. °Monday - Tuesday (8am-5pm) °Most Wednesdays (8:30-5pm) °$30 per visit, cash only  °[facility] ° [address], [place] (336) [phone] Patients are seen by appointment only. Walk-ins are not accepted. [facility] will see patients 18 years of age and older. °One   Wednesday Evening (Monthly: Volunteer Based).  $30 per visit, cash only  °[facility]  (919) [phone] for adults; Children under age 4, call Graduate Pediatric Dentistry at ([phone]) [phone]. Children aged 4-14, please call ([phone]) [phone] to request a pediatric application. ° Dental services are provided in all areas of dental care including fillings, crowns and bridges, complete and partial dentures, implants, gum treatment, root canals, and extractions. Preventive care is also provided. Treatment is provided to both adults and children. °Patients are selected via a lottery and there is often a waiting list. °  °[facility] [address], °[place] ° (336) 763-8833 [email or web address] °  °[facility] [address], [place], [place] (336)723-1848, Ext. 123 Second and Fourth Thursday of each month, opens at 6:30 AM; Clinic ends at 9 AM.  Patients are seen on a first-come first-served basis, and a limited number are seen during each clinic.  ° °[facility] ° [address], [place], [place] (336) [phone]    Eligibility Requirements °You must have lived in [place], or [place] counties for at least the last three months. °  You cannot be eligible for state or federal sponsored healthcare insurance, including Veterans Administration, Medicaid, or Medicare. °  You generally cannot be eligible for healthcare insurance through your employer.  °  How to apply: °Eligibility screenings are held every Tuesday and Wednesday afternoon from 1:00 pm until 4:00 pm. You do not need an appointment for the interview!  °[facility] [address], [place], [place] 336-631-2330   °[place] [facility]  336-342-8273   °[place]  336-703-3100   °[facility]  336-570-6415   ° °Behavioral Health Resources in the Community: °Intensive Outpatient Programs °Organization         Address  Phone  Notes  °[facility] [address], [place], [place] 336-878-6098   °[facility] [address], [place], [place] 336-832-9800   °[facility] [address], [place], [place] ° 336-882-2125   °[facility] [address],  °[place], [place] 1-800-853-5163 or 336-641-4981   °Substance Abuse Resources °Organization         Address  Phone  Notes  °[facility]  336-882-2125   °[facility]  336-784-9470   °[facility]  336-285-9073   °[facility]  336-845-3988   °Residential & Outpatient Substance Abuse Program  1-800-659-3381   °Psychological Services °Organization         Address  Phone  Notes  °[facility]  336- 832-9600   °[facility]  336- 378-7881   °[facility] [address], [place] 1-800-853-5163 or 336-641-4981   ° °Mobile Crisis Teams °Organization         Address  Phone  Notes  °[facility], Mobile Crisis Care Unit  1-877-626-1772   °Assertive °[facility] ° [address]  [place], [place] 336-834-9664   °Sharon DeEsch [address] °  336-554-5454   ° °Self-Help/Support Groups °Organization         Address  Phone             Notes  °[facility]  - variety of support groups  336- 373-1402 Call for more information  °Narcotics Anonymous (NA), [facility] [address], °[place]   2 meetings at this location  ° °  Residential Treatment Programs °Organization         Address  Phone  Notes  °[facility] [address],    °[place]  1-866-801-8205   °[facility] ° [address], [place], [place] 704-293-8524   °[facility] [address], [place] 336-845-3988 Admissions: 8am-3pm M-F  °[facility] [address]    °[place], [place] 336-841-1104   °[facility] [address], [place], [place] 336-379-7146   °[facility] [address]  °[place], [place] 336-285-9073   °[facility] Intensive Outpatient [address], [place], [place] 336-852-3033   °[facility] ([facility] [address]  °[place], [place] 1-877-615-2722 or 336-784-9470   °[facility] ([facility]) [address] [place], [place] 336-227-7417 Accepts Medicaid  °[facility] [address]  °[place] 1-800-659-3381 Substance Abuse/Addiction Treatment  ° °[place] Behavioral Health Resources °Organization         Address  Phone  Notes  °[facility]  (888) 581-9988   °Julie Brannon, PhD [address] [place], [place]   (336) 349-5553 or (336) 951-0000   °[facility]   [address] °[place], [place] (336) 349-4454   °[facility] [address], [place], [place] (336) 342-8316 Insurance/Medicaid/sponsorship through [facility]  °[facility] [address]                                    [place], [place] (336) 342-8316 Therapy/tele-psych/case    °[facility] [address]  ° [place], [place] (336) 349-2233    °Dr. Arfeen  (336) 349-4544   °[facility] [place]  [employer] [place] [facility] 1) [address], [place] °2) [address], [place] °3)  [address], [place] (336) 349-3220 °(336) 342-7768 ° °(336) 342-8140   °[place] Child Abuse Hotline (336) 342-1394 or (336) 342-3537 (After Hours)    ° ° °

## 2014-02-04 NOTE — ED Notes (Signed)
Pt has not taken bp meds in one month.  BP elevated in triage.

## 2014-02-04 NOTE — Progress Notes (Signed)
[facility] provided pt with a list of primary care resources and a [facility] [place] Card application to help patient establish primary care.  °

## 2014-02-04 NOTE — ED Notes (Signed)
Bed: WA24 Expected date:  Expected time:  Means of arrival:  Comments: EMS-N/V 

## 2014-10-20 ENCOUNTER — Emergency Department
Admit: 2014-10-20 | Disposition: A | Discharge status: Home / Self Care | Payer: Self-pay | Admitting: Emergency Medicine

## 2014-10-20 LAB — CBC WITH DIFFERENTIAL/PLATELET
BASOS ABS: 0.1 10*3/uL (ref 0.0–0.1)
Basophil %: 1 %
EOS PCT: 3.2 %
Eosinophil #: 0.4 10*3/uL (ref 0.0–0.7)
HCT: 44.1 % (ref 40.0–52.0)
HGB: 14.7 g/dL (ref 13.0–18.0)
Lymphocyte #: 3.7 10*3/uL — ABNORMAL HIGH (ref 1.0–3.6)
Lymphocyte %: 33 %
MCH: 30.2 pg (ref 26.0–34.0)
MCHC: 33.3 g/dL (ref 32.0–36.0)
MCV: 91 fL (ref 80–100)
MONOS PCT: 6.7 %
Monocyte #: 0.8 x10 3/mm (ref 0.2–1.0)
Neutrophil #: 6.3 10*3/uL (ref 1.4–6.5)
Neutrophil %: 56.1 %
PLATELETS: 162 10*3/uL (ref 150–440)
RBC: 4.86 10*6/uL (ref 4.40–5.90)
RDW: 14.7 % — AB (ref 11.5–14.5)
WBC: 11.3 10*3/uL — AB (ref 3.8–10.6)

## 2014-10-20 LAB — URINALYSIS, COMPLETE
BACTERIA: NONE SEEN
Bilirubin,UR: NEGATIVE
Blood: NEGATIVE
Glucose,UR: NEGATIVE mg/dL (ref 0–75)
KETONE: NEGATIVE
Leukocyte Esterase: NEGATIVE
Nitrite: NEGATIVE
Ph: 5 (ref 4.5–8.0)
Protein: 30
RBC,UR: NONE SEEN /HPF (ref 0–5)
Specific Gravity: 1.025 (ref 1.003–1.030)
Squamous Epithelial: 1
WBC UR: NONE SEEN /HPF (ref 0–5)

## 2014-10-20 LAB — COMPREHENSIVE METABOLIC PANEL
ALBUMIN: 4.4 g/dL
ALT: 21 U/L
Alkaline Phosphatase: 71 U/L
Anion Gap: 8 (ref 7–16)
BILIRUBIN TOTAL: 0.3 mg/dL
BUN: 13 mg/dL
CO2: 23 mmol/L
CREATININE: 1.25 mg/dL — AB
Calcium, Total: 8.8 mg/dL — ABNORMAL LOW
Chloride: 105 mmol/L
EGFR (African American): >60
GLUCOSE: 109 mg/dL — AB
Potassium: 3.2 mmol/L — ABNORMAL LOW
SGOT(AST): 25 U/L
Sodium: 136 mmol/L
TOTAL PROTEIN: 7.4 g/dL

## 2014-10-20 LAB — LIPASE, BLOOD: LIPASE: 51 U/L

## 2014-10-21 ENCOUNTER — Emergency Department (HOSPITAL_COMMUNITY)
Admission: EM | Admit: 2014-10-21 | Discharge: 2014-10-21 | Disposition: A | Discharge status: Home / Self Care | Payer: Self-pay | Attending: Emergency Medicine | Admitting: Emergency Medicine

## 2014-10-21 ENCOUNTER — Encounter (HOSPITAL_COMMUNITY): Payer: Self-pay

## 2014-10-21 DIAGNOSIS — R519 Headache, unspecified: Secondary | ICD-10-CM

## 2014-10-21 DIAGNOSIS — I10 Essential (primary) hypertension: Secondary | ICD-10-CM | POA: Insufficient documentation

## 2014-10-21 DIAGNOSIS — Z720 Tobacco use: Secondary | ICD-10-CM | POA: Insufficient documentation

## 2014-10-21 DIAGNOSIS — R51 Headache: Secondary | ICD-10-CM | POA: Insufficient documentation

## 2014-10-21 DIAGNOSIS — Z79899 Other long term (current) drug therapy: Secondary | ICD-10-CM | POA: Insufficient documentation

## 2014-10-21 DIAGNOSIS — G8929 Other chronic pain: Secondary | ICD-10-CM | POA: Insufficient documentation

## 2014-10-21 LAB — CBC
HCT: 42.5 % (ref 39.0–52.0)
Hemoglobin: 14.8 g/dL (ref 13.0–17.0)
MCH: 31.2 pg (ref 26.0–34.0)
MCHC: 34.8 g/dL (ref 30.0–36.0)
MCV: 89.5 fL (ref 78.0–100.0)
PLATELETS: 174 10*3/uL (ref 150–400)
RBC: 4.75 MIL/uL (ref 4.22–5.81)
RDW: 14.2 % (ref 11.5–15.5)
WBC: 11.3 10*3/uL — AB (ref 4.0–10.5)

## 2014-10-21 LAB — BASIC METABOLIC PANEL
ANION GAP: 8 (ref 5–15)
BUN: 13 mg/dL (ref 6–23)
CALCIUM: 9.1 mg/dL (ref 8.4–10.5)
CHLORIDE: 106 mmol/L (ref 96–112)
CO2: 24 mmol/L (ref 19–32)
CREATININE: 1.2 mg/dL (ref 0.50–1.35)
GFR calc non Af Amer: 75 mL/min — ABNORMAL LOW (ref >90)
GFR, EST AFRICAN AMERICAN: 87 mL/min — AB (ref >90)
Glucose, Bld: 99 mg/dL (ref 70–99)
Potassium: 3.9 mmol/L (ref 3.5–5.1)
Sodium: 138 mmol/L (ref 135–145)

## 2014-10-21 MED ORDER — AMLODIPINE BESYLATE 5 MG PO TABS: 5.0000 mg | ORAL_TABLET | Freq: Every day | ORAL | Status: DC

## 2014-10-21 MED ORDER — TRAMADOL HCL 50 MG PO TABS
50.0000 mg | ORAL_TABLET | Freq: Once | ORAL | Status: AC
  Administered 2014-10-21: 50 mg via ORAL
  Filled 2014-10-21: qty 1

## 2014-10-21 NOTE — ED Notes (Signed)
Pt has c/o generalized weakness with a h/a and blurred vision. Pt states [facility] rt abd pain.

## 2014-10-21 NOTE — ED Notes (Signed)
Pt is in stable condition upon d/c and ambulates from ED. 

## 2014-10-21 NOTE — ED Notes (Signed)
Pt. Woke up with  Headache to his lt. temperal area .  Having blurred vision and dizziness.  Denies any n/v/d Denies any photphobia

## 2014-10-21 NOTE — ED Provider Notes (Signed)
CSN: [id number]     Arrival date & time 10/21/14  7829 History   First MD Initiated Contact with Patient 10/21/14 [phone]     Chief Complaint  Patient presents with  . Migraine     (Consider location/radiation/quality/duration/timing/severity/associated sxs/prior Treatment) Patient is a 39 y.o. male presenting with migraines. The history is provided by the patient, the spouse and a relative.  Migraine Associated symptoms include headaches. Pertinent negatives include no chest pain, no abdominal pain and no shortness of breath.  pt c/o intermittent frontal headaches for the past week. Current headache onset this morning. Gradual at onset, and mild at onset. No acute or abrupt change. States noted headache while at rest, shortly after getting up today. No associated w any particular activity or exertion. No recent head injury, trauma or fall. No faintness or syncope. No eye pain or change in vision. No numbness/weakness. No neck pain or stiffness. No fever or chills. Denies sinus drainage or congestion. No associated unilateral numbness or weakness. No problems w speech. No problems w gait, balance or coordination. Similar to prior headaches. Family member states she wanted him checked out due to his bp for which he is not on tx currently.     Past Medical History  Diagnosis Date  . Chronic knee pain   . Hypertension    History reviewed. No pertinent past surgical history. No family history on file. History  Substance Use Topics  . Smoking status: Current Every Day Smoker -- 0.25 packs/day    Types: Cigarettes  . Smokeless tobacco: Not on file  . Alcohol Use: Yes     Comment: occ    Review of Systems  Constitutional: Negative for fever.  HENT: Negative for sinus pressure and sore throat.   Eyes: Negative for photophobia, pain, redness and visual disturbance.  Respiratory: Negative for shortness of breath.   Cardiovascular: Negative for chest pain.  Gastrointestinal: Negative for  vomiting and abdominal pain.  Genitourinary: Negative for flank pain.  Musculoskeletal: Negative for back pain and neck pain.  Skin: Negative for rash.  Neurological: Positive for headaches. Negative for syncope, speech difficulty, weakness and numbness.  Hematological: Does not bruise/bleed easily.  Psychiatric/Behavioral: Negative for confusion.      Allergies  Review of patient's allergies indicates no known allergies.  Home Medications   Prior to Admission medications   Medication Sig Start Date End Date Taking? Authorizing Provider  famotidine (PEPCID) 20 MG tablet Take 20 mg by mouth 2 (two) times daily as needed for heartburn or indigestion.    Historical Provider, MD  famotidine (PEPCID) 20 MG tablet Take 1 tablet (20 mg total) by mouth 2 (two) times daily. 02/04/14   Raeford Razor, MD  ondansetron (ZOFRAN) 4 MG tablet Take 1 tablet (4 mg total) by mouth every 8 (eight) hours as needed for nausea or vomiting. 02/04/14   Raeford Razor, MD   BP 169/105 mmHg  Pulse 67  Temp(Src) 98.2 F (36.8 C) (Oral)  Resp 12  Ht  (1.753 m)  Wt 230 lb (104.327 kg)  BMI 33.95 kg/m2  SpO2 98% Physical Exam  Constitutional: He is oriented to person, place, and time. He appears well-developed and well-nourished. No distress.  HENT:  Head: Atraumatic.  Mouth/Throat: Oropharynx is clear and moist.  No sinus or temporal tenderness.   Eyes: Conjunctivae are normal. Pupils are equal, round, and reactive to light.  Neck: Neck supple. No tracheal deviation present.  Cardiovascular: Normal rate, regular rhythm, normal heart sounds and  intact distal pulses.   Pulmonary/Chest: Effort normal and breath sounds normal. No accessory muscle usage. No respiratory distress.  Abdominal: Soft. Bowel sounds are normal. He exhibits no distension. There is no tenderness.  Genitourinary:  No cva tenderness  Musculoskeletal: Normal range of motion. He exhibits no edema or tenderness.  Neurological: He is  alert and oriented to person, place, and time. No cranial nerve deficit.  Speech clear and fluent. Responds to questions appropriately. Motor intact bil, stre 5/5. sens grossly intact. Steady gait.   Skin: Skin is warm and dry. He is not diaphoretic.  Psychiatric: He has a normal mood and affect.  Nursing note and vitals reviewed.   ED Course  Procedures (including critical care time) Labs Review  Results for orders placed or performed during the hospital encounter of 10/21/14  CBC  Result Value Ref Range   WBC 11.3 (H) 4.0 - 10.5 K/uL   RBC 4.75 4.22 - 5.81 MIL/uL   Hemoglobin 14.8 13.0 - 17.0 g/dL   HCT 13.042.5 86.539.0 - 78.452.0 %   MCV 89.5 78.0 - 100.0 fL   MCH 31.2 26.0 - 34.0 pg   MCHC 34.8 30.0 - 36.0 g/dL   RDW 69.614.2 29.511.5 - 28.415.5 %   Platelets 174 150 - 400 K/uL  Basic metabolic panel  Result Value Ref Range   Sodium 138 135 - 145 mmol/L   Potassium 3.9 3.5 - 5.1 mmol/L   Chloride 106 96 - 112 mmol/L   CO2 24 19 - 32 mmol/L   Glucose, Bld 99 70 - 99 mg/dL   BUN 13 6 - 23 mg/dL   Creatinine, Ser 1.321.20 0.50 - 1.35 mg/dL   Calcium 9.1 8.4 - 44.010.5 mg/dL   GFR calc non Af Amer 75 (L) >90 mL/min   GFR calc Af Amer 87 (L) >90 mL/min   Anion gap 8 5 - 15       EKG Interpretation   Date/Time:  Friday October 21 2014 09:15:07 EDT Ventricular Rate:  69 PR Interval:  152 QRS Duration: 91 QT Interval:  415 QTC Calculation: 445 R Axis:   61 Text Interpretation:  Sinus rhythm Nonspecific T wave abnormality No  significant change since last tracing Confirmed by Denton LankSTEINL  MD, Caryn BeeKEVIN  ([id number]) on 10/21/2014 10:12:53 AM      MDM   Reviewed nursing notes and prior charts for additional history.   Pt took an ibuprofen this AM at home w mild relief.  Ultram po (pt has ride, does not have to drive home),  bp noted similarly high in past, pt indicates yrs ago on bp meds but not since.   Intermittently mild headache for past week, consistent w prior.  Normal neuro exam. Afeb.  Recheck  bp.   bp improved.   Headache resolved.  Pt agreeable to outpatient follow up.  Pt currently symptom free and appears stable for d/c.     Cathren LaineKevin Sigismund Cross, MD 10/21/14 1209

## 2014-10-21 NOTE — Discharge Instructions (Signed)
It was our pleasure to provide your ER care today - we hope that you feel better.  Your blood pressure is high - take amlodipine as prescribed, limit salt intake, and follow up with primary care doctor in coming week for recheck.  Return to ER if worse, new symptoms, severe headache, other concern.  You were given pain medication in the ER - no driving for the next 4 hours.     Hypertension Hypertension, commonly called high blood pressure, is when the force of blood pumping through your arteries is too strong. Your arteries are the blood vessels that carry blood from your heart throughout your body. A blood pressure reading consists of a higher number over a lower number, such as 110/72. The higher number (systolic) is the pressure inside your arteries when your heart pumps. The lower number (diastolic) is the pressure inside your arteries when your heart relaxes. Ideally you want your blood pressure below 120/80. Hypertension forces your heart to work harder to pump blood. Your arteries may become narrow or stiff. Having hypertension puts you at risk for heart disease, stroke, and other problems.  RISK FACTORS Some risk factors for high blood pressure are controllable. Others are not.  Risk factors you cannot control include:   Race. You may be at higher risk if you are African American.  Age. Risk increases with age.  Gender. Men are at higher risk than women before age 10039 years. After age 39, women are at higher risk than men. Risk factors you can control include:  Not getting enough exercise or physical activity.  Being overweight.  Getting too much fat, sugar, calories, or salt in your diet.  Drinking too much alcohol. SIGNS AND SYMPTOMS Hypertension does not usually cause signs or symptoms. Extremely high blood pressure (hypertensive crisis) may cause headache, anxiety, shortness of breath, and nosebleed. DIAGNOSIS  To check if you have hypertension, your health care  provider will measure your blood pressure while you are seated, with your arm held at the level of your heart. It should be measured at least twice using the same arm. Certain conditions can cause a difference in blood pressure between your right and left arms. A blood pressure reading that is higher than normal on one occasion does not mean that you need treatment. If one blood pressure reading is high, ask your health care provider about having it checked again. TREATMENT  Treating high blood pressure includes making lifestyle changes and possibly taking medicine. Living a healthy lifestyle can help lower high blood pressure. You may need to change some of your habits. Lifestyle changes may include:  Following the DASH diet. This diet is high in fruits, vegetables, and whole grains. It is low in salt, red meat, and added sugars.  Getting at least 2 hours of brisk physical activity every week.  Losing weight if necessary.  Not smoking.  Limiting alcoholic beverages.  Learning ways to reduce stress. If lifestyle changes are not enough to get your blood pressure under control, your health care provider may prescribe medicine. You may need to take more than one. Work closely with your health care provider to understand the risks and benefits. HOME CARE INSTRUCTIONS  Have your blood pressure rechecked as directed by your health care provider.   Take medicines only as directed by your health care provider. Follow the directions carefully. Blood pressure medicines must be taken as prescribed. The medicine does not work as well when you skip doses. Skipping doses also puts  you at risk for problems.   Do not smoke.   Monitor your blood pressure at home as directed by your health care provider. SEEK MEDICAL CARE IF:   You think you are having a reaction to medicines taken.  You have recurrent headaches or feel dizzy.  You have swelling in your ankles.  You have trouble with your  vision. SEEK IMMEDIATE MEDICAL CARE IF:  You develop a severe headache or confusion.  You have unusual weakness, numbness, or feel faint.  You have severe chest or abdominal pain.  You vomit repeatedly.  You have trouble breathing. MAKE SURE YOU:   Understand these instructions.  Will watch your condition.  Will get help right away if you are not doing well or get worse. Document Released: 07/08/2005 Document Revised: 11/22/2013 Document Reviewed: 04/30/2013 [facility] Patient Information 2015 [place], [place]. This information is not intended to replace advice given to you by your health care provider. Make sure you discuss any questions you have with your health care provider.    General Headache Without Cause A headache is pain or discomfort felt around the head or neck area. The specific cause of a headache may not be found. There are many causes and types of headaches. A few common ones are:  Tension headaches.  Migraine headaches.  Cluster headaches.  Chronic daily headaches. HOME CARE INSTRUCTIONS   Keep all follow-up appointments with your caregiver or any specialist referral.  Only take over-the-counter or prescription medicines for pain or discomfort as directed by your caregiver.  Lie down in a dark, quiet room when you have a headache.  Keep a headache journal to find out what may trigger your migraine headaches. For example, write down:  What you eat and drink.  How much sleep you get.  Any change to your diet or medicines.  Try massage or other relaxation techniques.  Put ice packs or heat on the head and neck. Use these 3 to 4 times per day for 15 to 20 minutes each time, or as needed.  Limit stress.  Sit up straight, and do not tense your muscles.  Quit smoking if you smoke.  Limit alcohol use.  Decrease the amount of caffeine you drink, or stop drinking caffeine.  Eat and sleep on a regular schedule.  Get 7 to 9 hours of sleep, or as  recommended by your caregiver.  Keep lights dim if bright lights bother you and make your headaches worse. SEEK MEDICAL CARE IF:   You have problems with the medicines you were prescribed.  Your medicines are not working.  You have a change from the usual headache.  You have nausea or vomiting. SEEK IMMEDIATE MEDICAL CARE IF:   Your headache becomes severe.  You have a fever.  You have a stiff neck.  You have loss of vision.  You have muscular weakness or loss of muscle control.  You start losing your balance or have trouble walking.  You feel faint or pass out.  You have severe symptoms that are different from your first symptoms. MAKE SURE YOU:   Understand these instructions.  Will watch your condition.  Will get help right away if you are not doing well or get worse. Document Released: 07/08/2005 Document Revised: 09/30/2011 Document Reviewed: 07/24/2011 [facility] Patient Information 2015 [place], [place]. This information is not intended to replace advice given to you by your health care provider. Make sure you discuss any questions you have with your health care provider.

## 2015-04-28 ENCOUNTER — Emergency Department
Admission: EM | Admit: 2015-04-28 | Discharge: 2015-04-28 | Disposition: A | Discharge status: Home / Self Care | Payer: Self-pay | Attending: Emergency Medicine | Admitting: Emergency Medicine

## 2015-04-28 ENCOUNTER — Emergency Department: Payer: Self-pay

## 2015-04-28 ENCOUNTER — Encounter: Payer: Self-pay | Admitting: Emergency Medicine

## 2015-04-28 DIAGNOSIS — I10 Essential (primary) hypertension: Secondary | ICD-10-CM | POA: Insufficient documentation

## 2015-04-28 DIAGNOSIS — Z720 Tobacco use: Secondary | ICD-10-CM | POA: Insufficient documentation

## 2015-04-28 DIAGNOSIS — R0789 Other chest pain: Secondary | ICD-10-CM | POA: Insufficient documentation

## 2015-04-28 LAB — FIBRIN DERIVATIVES D-DIMER (ARMC ONLY): Fibrin derivatives D-dimer (ARMC): 246 (ref 0–499)

## 2015-04-28 LAB — CBC
HCT: 42.9 % (ref 40.0–52.0)
Hemoglobin: 14.3 g/dL (ref 13.0–18.0)
MCH: 30.3 pg (ref 26.0–34.0)
MCHC: 33.4 g/dL (ref 32.0–36.0)
MCV: 90.6 fL (ref 80.0–100.0)
Platelets: 162 10*3/uL (ref 150–440)
RBC: 4.73 MIL/uL (ref 4.40–5.90)
RDW: 14.3 % (ref 11.5–14.5)
WBC: 11.6 10*3/uL — ABNORMAL HIGH (ref 3.8–10.6)

## 2015-04-28 LAB — BASIC METABOLIC PANEL
Anion gap: 6 (ref 5–15)
BUN: 15 mg/dL (ref 6–20)
CO2: 25 mmol/L (ref 22–32)
CREATININE: 1.08 mg/dL (ref 0.61–1.24)
Calcium: 8.9 mg/dL (ref 8.9–10.3)
Chloride: 109 mmol/L (ref 101–111)
GFR calc Af Amer: >60 mL/min (ref >60)
GFR calc non Af Amer: >60 mL/min (ref >60)
Glucose, Bld: 95 mg/dL (ref 65–99)
Potassium: 4 mmol/L (ref 3.5–5.1)
SODIUM: 140 mmol/L (ref 135–145)

## 2015-04-28 LAB — TROPONIN I

## 2015-04-28 MED ORDER — ASPIRIN 81 MG PO CHEW
324.0000 mg | CHEWABLE_TABLET | Freq: Once | ORAL | Status: AC
  Administered 2015-04-28: 324 mg via ORAL
  Filled 2015-04-28: qty 4

## 2015-04-28 MED ORDER — NITROGLYCERIN 0.4 MG SL SUBL
0.4000 mg | SUBLINGUAL_TABLET | SUBLINGUAL | Status: DC | PRN
  Administered 2015-04-28: 0.4 mg via SUBLINGUAL
  Filled 2015-04-28: qty 1

## 2015-04-28 NOTE — ED Notes (Signed)
Pt to ed with c/o chest pain that started last night intermittently.  Pt states central chest pain sharp and shooting.  Pt reports sob, weakness, lightheadedness with pain.

## 2015-04-28 NOTE — ED Notes (Signed)
Pt states central chest pain since last night, denies any nausea or vomiting, states some weakness, family at bedside, Dr. Fanny Bien at bedside

## 2015-04-28 NOTE — Discharge Instructions (Signed)
You have been seen in the Emergency Department (ED) today for chest pain.  As we have discussed todays test results are normal, but you may require further testing.  Please follow up with the recommended Dr. Park Breed at his office this morning as instructed above in these documents regarding todays emergent visit and your recent symptoms to discuss further management.  Continue to take your regular medications. If you are not doing so already, please also take a daily baby aspirin (81 mg), at least until you follow up with your doctor.  Return to the Emergency Department (ED) if you experience any further chest pain/pressure/tightness, difficulty breathing, or sudden sweating, or other symptoms that concern you.   Chest Pain Observation It is often hard to give a specific diagnosis for the cause of chest pain. Among other possibilities your symptoms might be caused by inadequate oxygen delivery to your heart (angina). Angina that is not treated or evaluated can lead to a heart attack (myocardial infarction) or death. Blood tests, electrocardiograms, and X-rays may have been done to help determine a possible cause of your chest pain. After evaluation and observation, your health care provider has determined that it is unlikely your pain was caused by an unstable condition that requires hospitalization. However, a full evaluation of your pain may need to be completed, with additional diagnostic testing as directed. It is very important to keep your follow-up appointments. Not keeping your follow-up appointments could result in permanent heart damage, disability, or death. If there is any problem keeping your follow-up appointments, you must call your health care provider. HOME CARE INSTRUCTIONS  Due to the slight chance that your pain could be angina, it is important to follow your health care provider's treatment plan and also maintain a healthy lifestyle:  Maintain or work toward achieving a healthy  weight.  Stay physically active and exercise regularly.  Decrease your salt intake.  Eat a balanced, healthy diet. Talk to a dietitian to learn about heart-healthy foods.  Increase your fiber intake by including whole grains, vegetables, fruits, and nuts in your diet.  Avoid situations that cause stress, anger, or depression.  Take medicines as advised by your health care provider. Report any side effects to your health care provider. Do not stop medicines or adjust the dosages on your own.  Quit smoking. Do not use nicotine patches or gum until you check with your health care provider.  Keep your blood pressure, blood sugar, and cholesterol levels within normal limits.  Limit alcohol intake to no more than 1 drink per day for women who are not pregnant and 2 drinks per day for men.  Do not abuse drugs. SEEK IMMEDIATE MEDICAL CARE IF: You have severe chest pain or pressure which may include symptoms such as:  You feel pain or pressure in your arms, neck, jaw, or back.  You have severe back or abdominal pain, feel sick to your stomach (nauseous), or throw up (vomit).  You are sweating profusely.  You are having a fast or irregular heartbeat.  You feel short of breath while at rest.  You notice increasing shortness of breath during rest, sleep, or with activity.  You have chest pain that does not get better after rest or after taking your usual medicine.  You wake from sleep with chest pain.  You are unable to sleep because you cannot breathe.  You develop a frequent cough or you are coughing up blood.  You feel dizzy, faint, or experience extreme fatigue.  You develop severe weakness, dizziness, fainting, or chills. Any of these symptoms may represent a serious problem that is an emergency. Do not wait to see if the symptoms will go away. Call your local emergency services (911 in the [place] Do not drive yourself to the hospital. MAKE SURE YOU:  Understand these  instructions.  Will watch your condition.  Will get help right away if you are not doing well or get worse.   This information is not intended to replace advice given to you by your health care provider. Make sure you discuss any questions you have with your health care provider.   Document Released: 08/10/2010 Document Revised: 07/13/2013 Document Reviewed: 01/07/2013 [employer] Interactive Patient Education [employer]

## 2015-04-28 NOTE — ED Provider Notes (Signed)
[facility] Emergency Department Provider Note REMINDER - THIS NOTE IS NOT A FINAL MEDICAL RECORD UNTIL IT IS SIGNED. UNTIL THEN, THE CONTENT BELOW MAY REFLECT INFORMATION FROM A DOCUMENTATION TEMPLATE, NOT THE ACTUAL PATIENT VISIT. ____________________________________________  Time seen: Approximately 9:49 AM  I have reviewed the triage vital signs and the nursing notes.   HISTORY  Chief Complaint Chest Pain    HPI Jonathan Caldwell is a 39 y.o. male reports that he's been having chest pain which he describes as uncomfortable and somewhat sharp and pressure-like in the mid chest since last night.The pain comes and goes and gets worse at times, currently mild. Does report a history of high blood pressure and is off of his blood pressure medicine for the last 1 month. He is a smoker. Reports no significant family history of early-onset coronary disease, though his mother does of high blood pressure.  Patient denies radiation of the pain. There is no associated shortness of breath, nausea or sweats. He denies previous history of coronary disease.   Past Medical History  Diagnosis Date  . Chronic knee pain   . Hypertension     Patient Active Problem List   Diagnosis Date Noted  . 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 ALCOHOL ABUSE, HX OF 05/09/2009  . TOBACCO ABUSE, HX OF 05/09/2009  . CHEST PAIN, ATYPICAL, HX OF 05/09/2009    History reviewed. No pertinent past surgical history.  No current outpatient prescriptions on file.  Allergies Review of patient's allergies indicates no known allergies.  History reviewed. No pertinent family history.  Social History Social History  Substance Use Topics  . Smoking status: Current Every Day Smoker -- 0.25 packs/day    Types: Cigarettes  . Smokeless tobacco: None  . Alcohol Use: Yes     Comment: occ    Review of Systems Constitutional: No fever/chills Eyes: No visual changes. ENT: No sore  throat. Cardiovascular: See history of present illness Respiratory: Denies shortness of breath. Gastrointestinal: No abdominal pain.  No nausea, no vomiting.  No diarrhea.  No constipation. Genitourinary: Negative for dysuria. Musculoskeletal: Negative for back pain. Skin: Negative for rash. Neurological: Negative for headaches, focal weakness or numbness.  10-point ROS otherwise negative.  ____________________________________________   PHYSICAL EXAM:  VITAL SIGNS: ED Triage Vitals  Enc Vitals Group     BP 04/28/15 0925 153/102 mmHg     Pulse Rate 04/28/15 0925 72     Resp 04/28/15 0925 20     Temp 04/28/15 0925 98.4 F (36.9 C)     Temp Source 04/28/15 0925 Oral     SpO2 04/28/15 0925 94 %     Weight 04/28/15 0925 230 lb (104.327 kg)     Height 04/28/15 0925 6' (1.829 m)     Head Cir --      Peak Flow --      Pain Score 04/28/15 0927 8     Pain Loc --      Pain Edu? --      Excl. in GC? --    Constitutional: Alert and oriented. Well appearing and in no acute distress. Calm, fiance braiding his hair. Eyes: Conjunctivae are normal. PERRL. EOMI. Head: Atraumatic. Nose: No congestion/rhinnorhea. Mouth/Throat: Mucous membranes are moist.  Oropharynx non-erythematous. Neck: No stridor.   Cardiovascular: Normal rate, regular rhythm. Grossly normal heart sounds.  Good peripheral circulation. No reproducible chest wall tenderness. Respiratory: Normal respiratory effort.  No retractions. Lungs CTAB. Gastrointestinal: Soft and nontender. No distention. No abdominal bruits.  No CVA tenderness. Musculoskeletal: No lower extremity tenderness nor edema.  No joint effusions. Neurologic:  Normal speech and language. No gross focal neurologic deficits are appreciated. No gait instability. Skin:  Skin is warm, dry and intact. No rash noted. Psychiatric: Mood and affect are normal. Speech and behavior are normal.      Pulmonary Embolism Rule-out Criteria (PERC rule)                         If YES to ANY of the following, the PERC rule is not satisfied and cannot be used to rule out PE in this patient (consider d-dimer or imaging depending on pre-test probability).                      If NO to ALL of the following, AND the clinician's pre-test probability is <15%, the [facility] rule is satisfied and there is no need for further workup (including no need to obtain a d-dimer) as the post-test probability of pulmonary embolism is <2%.                      Mnemonic is HAD CLOTS   H - hormone use (exogenous estrogen)      No. A - age > 50                                                 No. D - DVT/PE history                                      No.   C - coughing blood (hemoptysis)                 No. L - leg swelling, unilateral                             No. O - O2 Sat on Room Air < 95%                  Yes.   T - tachycardia (HR ? 100)                         No. S - surgery or trauma, recent                      No.   Based on my evaluation of the patient, including application of this decision instrument, further testing to evaluate for pulmonary embolism is indicated at this time. I have discussed this recommendation with the patient who states understanding and agreement with this plan.   ____________________________________________   LABS (all labs ordered are listed, but only abnormal results are displayed)  Labs Reviewed  CBC - Abnormal; Notable for the following:    WBC 11.6 (*)    All other components within normal limits  BASIC METABOLIC PANEL  TROPONIN I  FIBRIN DERIVATIVES D-DIMER ([facility] ONLY)  URINE DRUG SCREEN, QUALITATIVE ([facility] ONLY)   ____________________________________________  EKG  ED ECG REPORT I, Louna Rothgeb, the attending physician, personally viewed and interpreted this ECG.  Date: 04/28/2015 EKG Time: 935 Rate: 65 Rhythm:  normal sinus rhythm QRS Axis: normal Intervals: normal ST/T Wave abnormalities: T wave inversions noted in  lateral precordial and chest leads. These are new compared with previous. Conduction Disutrbances: none Narrative Interpretation: Normal sinus rhythm, new T-wave inversion seen in lateral leads.  ____________________________________________  RADIOLOGY  DG Chest Port 1 View (Final result) Result time: 04/28/15 10:03:46   Final result by Rad Results In Interface (04/28/15 10:03:46)   Narrative:   CLINICAL DATA: Chest pain  EXAM: PORTABLE CHEST - 1 VIEW  COMPARISON: 08/23/2013  FINDINGS: Cardiac shadow is mildly enlarged. The lungs are clear bilaterally. No focal infiltrate or sizable effusion is noted. Bilateral nipple shadows are seen. No bony abnormality is noted. No acute abnormality seen.  IMPRESSION: No active disease.    ____________________________________________   PROCEDURES  Procedure(s) performed: None  Critical Care performed: No  ____________________________________________   INITIAL IMPRESSION / ASSESSMENT AND PLAN / ED COURSE  Pertinent labs & imaging results that were available during my care of the patient were reviewed by me and considered in my medical decision making (see chart for details).  Patient present's for chest pain. The patient has sharp and somewhat pleuritic chest pain, but this is associated with new T-wave abnormality and EKG. In reviewing his EKG it is possible these are somewhat chronic in the setting of possible left ventricular hypertrophy, though the patient does not technically meeting ECG criteria.  Primary concern for this patient with a known history of smoking, medication noncompliance be to rule out acute coronary syndrome. His symptoms are somewhat atypical of acute coronary syndrome, but EKG is abnormal. In addition his oxygen saturation is slightly lower than 95% and we'll send d-dimer to exclude pulmonary embolism.  ----------------------------------------- 11:06 AM on  04/28/2015 -----------------------------------------  Patient reports pain and symptoms resolved. His d-dimer is negative. Chest x-ray is clear. Patient would be low risk for acute coronary syndrome based on his HEART score. Reviewed EKG with Dr. Park Breed of cardiology, who advises does not appear to be ischemic abnormality but possibly secondary to LVH. Discussed with the patient, and plan is to discharge the patient to go directly to Dr. Santo Held office for evaluation this morning. Patient's fianc will be driving him there, discharging the patient was planned to go drip to cardiology for further evaluation.  Return precautions advised. ____________________________________________   FINAL CLINICAL IMPRESSION(S) / ED DIAGNOSES  Final diagnoses:  Atypical chest pain      Sharyn Creamer, MD 04/28/15 1113

## 2015-06-03 ENCOUNTER — Emergency Department
Admission: EM | Admit: 2015-06-03 | Discharge: 2015-06-03 | Disposition: A | Discharge status: Home / Self Care | Payer: Self-pay | Attending: Emergency Medicine | Admitting: Emergency Medicine

## 2015-06-03 ENCOUNTER — Emergency Department: Payer: Self-pay

## 2015-06-03 DIAGNOSIS — Z720 Tobacco use: Secondary | ICD-10-CM | POA: Insufficient documentation

## 2015-06-03 DIAGNOSIS — I10 Essential (primary) hypertension: Secondary | ICD-10-CM | POA: Insufficient documentation

## 2015-06-03 DIAGNOSIS — Z79899 Other long term (current) drug therapy: Secondary | ICD-10-CM | POA: Insufficient documentation

## 2015-06-03 DIAGNOSIS — K529 Noninfective gastroenteritis and colitis, unspecified: Secondary | ICD-10-CM | POA: Insufficient documentation

## 2015-06-03 LAB — CBC
HEMATOCRIT: 44.5 % (ref 40.0–52.0)
Hemoglobin: 15.1 g/dL (ref 13.0–18.0)
MCH: 30.7 pg (ref 26.0–34.0)
MCHC: 33.9 g/dL (ref 32.0–36.0)
MCV: 90.5 fL (ref 80.0–100.0)
PLATELETS: 195 10*3/uL (ref 150–440)
RBC: 4.91 MIL/uL (ref 4.40–5.90)
RDW: 14.1 % (ref 11.5–14.5)
WBC: 11 10*3/uL — ABNORMAL HIGH (ref 3.8–10.6)

## 2015-06-03 LAB — URINALYSIS COMPLETE WITH MICROSCOPIC (ARMC ONLY)
BACTERIA UA: NONE SEEN
BILIRUBIN URINE: NEGATIVE
Glucose, UA: NEGATIVE mg/dL
KETONES UR: NEGATIVE mg/dL
Leukocytes, UA: NEGATIVE
Nitrite: NEGATIVE
PH: 5 (ref 5.0–8.0)
Protein, ur: NEGATIVE mg/dL
SPECIFIC GRAVITY, URINE: 1.018 (ref 1.005–1.030)

## 2015-06-03 LAB — COMPREHENSIVE METABOLIC PANEL
ALBUMIN: 4.1 g/dL (ref 3.5–5.0)
ALT: 17 U/L (ref 17–63)
AST: 20 U/L (ref 15–41)
Alkaline Phosphatase: 60 U/L (ref 38–126)
Anion gap: 7 (ref 5–15)
BUN: 12 mg/dL (ref 6–20)
CHLORIDE: 107 mmol/L (ref 101–111)
CO2: 25 mmol/L (ref 22–32)
CREATININE: 1.07 mg/dL (ref 0.61–1.24)
Calcium: 9 mg/dL (ref 8.9–10.3)
GFR calc Af Amer: >60 mL/min (ref >60)
GFR calc non Af Amer: >60 mL/min (ref >60)
Glucose, Bld: 124 mg/dL — ABNORMAL HIGH (ref 65–99)
Potassium: 3.5 mmol/L (ref 3.5–5.1)
SODIUM: 139 mmol/L (ref 135–145)
Total Bilirubin: 0.3 mg/dL (ref 0.3–1.2)
Total Protein: 7.3 g/dL (ref 6.5–8.1)

## 2015-06-03 LAB — LIPASE, BLOOD: LIPASE: 33 U/L (ref 11–51)

## 2015-06-03 MED ORDER — IOHEXOL 240 MG/ML SOLN
25.0000 mL | Freq: Once | INTRAMUSCULAR | Status: AC | PRN
  Administered 2015-06-03: 25 mL via ORAL

## 2015-06-03 MED ORDER — ONDANSETRON HCL 4 MG/2ML IJ SOLN
4.0000 mg | Freq: Once | INTRAMUSCULAR | Status: AC
  Administered 2015-06-03: 4 mg via INTRAVENOUS
  Filled 2015-06-03: qty 2

## 2015-06-03 MED ORDER — SODIUM CHLORIDE 0.9 % IV BOLUS (SEPSIS)
1000.0000 mL | Freq: Once | INTRAVENOUS | Status: AC
  Administered 2015-06-03: 1000 mL via INTRAVENOUS

## 2015-06-03 MED ORDER — ONDANSETRON 4 MG PO TBDP: 4.0000 mg | ORAL_TABLET | Freq: Four times a day (QID) | ORAL | Status: DC | PRN

## 2015-06-03 MED ORDER — MORPHINE SULFATE (PF) 4 MG/ML IV SOLN
4.0000 mg | Freq: Once | INTRAVENOUS | Status: AC
  Administered 2015-06-03: 4 mg via INTRAVENOUS
  Filled 2015-06-03: qty 1

## 2015-06-03 MED ORDER — IOHEXOL 300 MG/ML  SOLN
100.0000 mL | Freq: Once | INTRAMUSCULAR | Status: AC | PRN
  Administered 2015-06-03: 100 mL via INTRAVENOUS

## 2015-06-03 NOTE — ED Provider Notes (Signed)
[facility] Emergency Department Provider Note REMINDER - THIS NOTE IS NOT A FINAL MEDICAL RECORD UNTIL IT IS SIGNED. UNTIL THEN, THE CONTENT BELOW MAY REFLECT INFORMATION FROM A DOCUMENTATION TEMPLATE, NOT THE ACTUAL PATIENT VISIT. ____________________________________________  Time seen: Approximately 10:20 AM  I have reviewed the triage vital signs and the nursing notes.   HISTORY  Chief Complaint Abdominal Pain    HPI Jonathan Caldwell is a 39 y.o. male patient reports having lower achy abdominal pain for about one month, however this morning it got worse and was associated with 2-3 loose watery bowel movements. Denies black or bloody stool. No nausea or vomiting. No fevers or chills. He is able to eat and drink well.  The patient reports that last night he had a few drinks, including alcoholic beverages. This morning when he woke up he noted he was having loose stools 3 times. No trips or travel. No known bad water exposures.  Condition have an achy pain in the right lower abdomen at this time. No pain in the testicles, groin, or penis. No back pain chest pain or trouble breathing.   Past Medical History  Diagnosis Date  . Chronic knee pain   . Hypertension     Patient Active Problem List   Diagnosis Date Noted  . 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 ALCOHOL ABUSE, HX OF 05/09/2009  . TOBACCO ABUSE, HX OF 05/09/2009  . CHEST PAIN, ATYPICAL, HX OF 05/09/2009    History reviewed. No pertinent past surgical history.  Current Outpatient Rx  Name  Route  Sig  Dispense  Refill  . amLODipine (NORVASC) 5 MG tablet   Oral   Take 5 mg by mouth daily.         . ondansetron (ZOFRAN ODT) 4 MG disintegrating tablet   Oral   Take 1 tablet (4 mg total) by mouth every 6 (six) hours as needed for nausea or vomiting.   20 tablet   0     Allergies Review of patient's allergies indicates no known allergies.  No family history on file.  Social History Social  History  Substance Use Topics  . Smoking status: Current Every Day Smoker -- 0.25 packs/day    Types: Cigarettes  . Smokeless tobacco: None  . Alcohol Use: Yes     Comment: occ    Review of Systems Constitutional: No fever/chills Eyes: No visual changes. ENT: No sore throat. Cardiovascular: Denies chest pain. Respiratory: Denies shortness of breath. Gastrointestinal:   No constipation. Genitourinary: Negative for dysuria. Musculoskeletal: Negative for back pain. Skin: Negative for rash. Neurological: Negative for headaches, focal weakness or numbness.  10-point ROS otherwise negative.  ____________________________________________   PHYSICAL EXAM:  VITAL SIGNS: ED Triage Vitals  Enc Vitals Group     BP 06/03/15 0809 143/105 mmHg     Pulse Rate 06/03/15 0808 72     Resp 06/03/15 0808 18     Temp 06/03/15 0808 97.8 F (36.6 C)     Temp Source 06/03/15 0808 Oral     SpO2 06/03/15 0808 97 %     Weight 06/03/15 0808 230 lb (104.327 kg)     Height 06/03/15 0808 6' (1.829 m)     Head Cir --      Peak Flow --      Pain Score 06/03/15 0808 10     Pain Loc --      Pain Edu? --      Excl. in GC? --  Constitutional: Alert and oriented. Well appearing and in no acute distress. Eyes: Conjunctivae are normal. PERRL. EOMI. Head: Atraumatic. Nose: No congestion/rhinnorhea. Mouth/Throat: Mucous membranes are moist.  Oropharynx non-erythematous. Neck: No stridor.   Cardiovascular: Normal rate, regular rhythm. Grossly normal heart sounds.  Good peripheral circulation. Respiratory: Normal respiratory effort.  No retractions. Lungs CTAB. Gastrointestinal: Soft and nontender except for moderate focal tenderness in the right lower quadrant without associated rebound or guarding. No distention. No abdominal bruits. No CVA tenderness. Normal penis, normal testicles and scrotum. No testicular tenderness. No groin pain or mass. Musculoskeletal: No lower extremity tenderness nor edema.   No joint effusions. Neurologic:  Normal speech and language. No gross focal neurologic deficits are appreciated. Skin:  Skin is warm, dry and intact. No rash noted. Psychiatric: Mood and affect are normal. Speech and behavior are normal.  ____________________________________________   LABS (all labs ordered are listed, but only abnormal results are displayed)  Labs Reviewed  COMPREHENSIVE METABOLIC PANEL - Abnormal; Notable for the following:    Glucose, Bld 124 (*)    All other components within normal limits  CBC - Abnormal; Notable for the following:    WBC 11.0 (*)    All other components within normal limits  URINALYSIS COMPLETEWITH MICROSCOPIC ([facility] ONLY) - Abnormal; Notable for the following:    Color, Urine YELLOW (*)    APPearance CLEAR (*)    Hgb urine dipstick 2+ (*)    Squamous Epithelial / LPF 0-5 (*)    All other components within normal limits  LIPASE, BLOOD   ____________________________________________  EKG   ____________________________________________  RADIOLOGY  IMPRESSION: No acute intra-abdominal or pelvic finding.  Normal appendix  Minor basilar atelectasis  Focal fatty infiltration of the liver  Aortoiliac atherosclerosis ____________________________________________   PROCEDURES  Procedure(s) performed: None  Critical Care performed: No  ____________________________________________   INITIAL IMPRESSION / ASSESSMENT AND PLAN / ED COURSE  Pertinent labs & imaging results that were available during my care of the patient were reviewed by me and considered in my medical decision making (see chart for details).  Patient presents with partially one month of achy pain in the lower abdomen, now having some loose stools this morning. He did have a few alcoholic beverages last night which may be contributory, but on exam he does have somewhat focal tenderness located in the right lower quadrant. Given that the patient has never had an  appendectomy, we'll pursue CT imaging to further evaluate and exclude appendicitis given the geographic location of his pain. There is no evidence of testicular process or groin mass. He is afebrile and does have a slight leukocytosis this appears to be chronic in nature. I suspect that after fluids and Zofran if his CT is unrevealing that this likely represents a mild colitis, and the patient could likely be discharged safely with good return precautions assuming his CT is within normal limits.  ----------------------------------------- 12:04 PM on 06/03/2015 -----------------------------------------  Patient's CT is negative. I did discuss his incidental findings with him. His pain and symptoms are resolved. At this point, no evidence of appendicitis. Urinalysis demonstrates no obvious infection, and CT is reassuring. Discussed with patient abdominal pain return precautions, provide him a prescription for Zofran. Patient agreeable, his friend is driving him home. ____________________________________________   FINAL CLINICAL IMPRESSION(S) / ED DIAGNOSES  Final diagnoses:  Enteritis      Sharyn CreamerMark Yosiah Jasmin, MD 06/03/15 1207

## 2015-06-03 NOTE — ED Notes (Signed)
Pt c/o lower abd pain for the past month, worse today.. Denies N/V

## 2016-01-09 ENCOUNTER — Emergency Department
Admission: EM | Admit: 2016-01-09 | Discharge: 2016-01-09 | Disposition: A | Discharge status: Home / Self Care | Payer: Self-pay | Attending: Emergency Medicine | Admitting: Emergency Medicine

## 2016-01-09 ENCOUNTER — Encounter: Payer: Self-pay | Admitting: Emergency Medicine

## 2016-01-09 DIAGNOSIS — Z79899 Other long term (current) drug therapy: Secondary | ICD-10-CM | POA: Insufficient documentation

## 2016-01-09 DIAGNOSIS — I10 Essential (primary) hypertension: Secondary | ICD-10-CM | POA: Insufficient documentation

## 2016-01-09 DIAGNOSIS — F1290 Cannabis use, unspecified, uncomplicated: Secondary | ICD-10-CM | POA: Insufficient documentation

## 2016-01-09 DIAGNOSIS — M545 Low back pain, unspecified: Secondary | ICD-10-CM

## 2016-01-09 DIAGNOSIS — F17210 Nicotine dependence, cigarettes, uncomplicated: Secondary | ICD-10-CM | POA: Insufficient documentation

## 2016-01-09 MED ORDER — KETOROLAC TROMETHAMINE 30 MG/ML IJ SOLN
30.0000 mg | Freq: Once | INTRAMUSCULAR | Status: AC
  Administered 2016-01-09: 30 mg via INTRAVENOUS
  Filled 2016-01-09: qty 1

## 2016-01-09 MED ORDER — CYCLOBENZAPRINE HCL 10 MG PO TABS: 10.0000 mg | ORAL_TABLET | Freq: Three times a day (TID) | ORAL | Status: DC | PRN

## 2016-01-09 MED ORDER — NAPROXEN 500 MG PO TABS: 500.0000 mg | ORAL_TABLET | Freq: Two times a day (BID) | ORAL | Status: DC

## 2016-01-09 NOTE — ED Provider Notes (Signed)
[facility] Emergency Department Provider Note  ____________________________________________  Time seen: Approximately 12:11 PM  I have reviewed the triage vital signs and the nursing notes.   HISTORY  Chief Complaint Flank Pain    HPI Jonathan Caldwell is a 40 y.o. male, NAD, who presents to the emergency department with left lower back pain. He was driving on Saturday morning when he reached for something in the floorboard at the same time that he coughed and he felt a "pinch" in his left lower back. He took an Aleve later that day, felt a little better, and played basketball with his son. He then had worsening pain with activity. Since then, he wakes with pain every morning in his left lower back and takes Aleve with some relief. He denies any history of back pain before this occurance. The pain is non-radiating and is worse with leaning forward.  He denies fever, headache, chest pain, shortness of breath, nausea, vomiting, abdominal pain, changes in urination, dysuria, nor radiculopathy in the lower extremities. No saddle paresthesias nor loss of bowel or bladder control.   Past Medical History  Diagnosis Date  . Chronic knee pain   . Hypertension     Patient Active Problem List   Diagnosis Date Noted  . 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 ALCOHOL ABUSE, HX OF 05/09/2009  . TOBACCO ABUSE, HX OF 05/09/2009  . CHEST PAIN, ATYPICAL, HX OF 05/09/2009    History reviewed. No pertinent past surgical history.  Current Outpatient Rx  Name  Route  Sig  Dispense  Refill  . amLODipine (NORVASC) 5 MG tablet   Oral   Take 5 mg by mouth daily.         . cyclobenzaprine (FLEXERIL) 10 MG tablet   Oral   Take 1 tablet (10 mg total) by mouth 3 (three) times daily as needed for muscle spasms.   30 tablet   0   . naproxen (NAPROSYN) 500 MG tablet   Oral   Take 1 tablet (500 mg total) by mouth 2 (two) times daily with a meal.   30 tablet   0   . ondansetron (ZOFRAN  ODT) 4 MG disintegrating tablet   Oral   Take 1 tablet (4 mg total) by mouth every 6 (six) hours as needed for nausea or vomiting.   20 tablet   0     Allergies Review of patient's allergies indicates no known allergies.  History reviewed. No pertinent family history.  Social History Social History  Substance Use Topics  . Smoking status: Current Every Day Smoker -- 0.25 packs/day    Types: Cigarettes  . Smokeless tobacco: None  . Alcohol Use: Yes     Comment: occ     Review of Systems  Constitutional: No fever/chills Eyes: No visual changes.  Cardiovascular: No chest pain. Respiratory: No shortness of breath. No wheezing.  Gastrointestinal: Positive occasional constipation. No abdominal pain.  No nausea, vomiting.  No diarrhea.  Genitourinary: Negative for dysuria, hematuria. No urinary hesitancy, urgency or increased frequency. Musculoskeletal: Positive for back pain.  Skin: Negative for rash, Redness, swelling. Neurological: Negative for headaches, focal weakness or numbness. No saddle paresthesias, loss of bowel or bladder control. No tingling. 10-point ROS otherwise negative.  ____________________________________________   PHYSICAL EXAM:  VITAL SIGNS: ED Triage Vitals  Enc Vitals Group     BP 01/09/16 1135 151/100 mmHg     Pulse Rate 01/09/16 1135 83     Resp 01/09/16 1135 20  Temp 01/09/16 1135 98.1 F (36.7 C)     Temp Source 01/09/16 1135 Oral     SpO2 01/09/16 1135 100 %     Weight 01/09/16 1135 236 lb (107.049 kg)     Height 01/09/16 1135  (1.854 m)     Head Cir --      Peak Flow --      Pain Score 01/09/16 1136 8     Pain Loc --      Pain Edu? --      Excl. in GC? --      Constitutional: Alert and oriented. Well appearing and in no acute distress. Eyes: Conjunctivae are normal.  Head: Atraumatic. Neck: Supple with full range of motion. No cervical spine tenderness to palpation Hematological/Lymphatic/Immunilogical: No cervical  lymphadenopathy. Cardiovascular: Normal rate, regular rhythm. Normal S1 and S2.  Good peripheral circulation with 2+ pulses noted in bilateral lower extremities. Respiratory: Normal respiratory effort without tachypnea or retractions. Lungs CTAB with breath sounds noted in all lung fields. Gastrointestinal: Soft and nontender without distention or guarding in all quadrants. No CVA tenderness. Musculoskeletal: Full range of lumbar spine but mild pain with full flexion. Pain to palpation about the left lateral lower back but no masses or abnormalities noted. No thoracic or lumbar central spine tenderness to palpation. No lower extremity tenderness nor edema.  No joint effusions. Neurologic:  Normal speech and language. No gross focal neurologic deficits are appreciated. Gait and posture are normal Skin:  Skin is warm, dry and intact. No rash, redness, swelling, bruising noted. Psychiatric: Mood and affect are normal. Speech and behavior are normal. Patient exhibits appropriate insight and judgement.   ____________________________________________   LABS  None ____________________________________________  EKG  None ____________________________________________  RADIOLOGY  None ____________________________________________    PROCEDURES  Procedure(s) performed: None   Medications  ketorolac (TORADOL) 30 MG/ML injection 30 mg (30 mg Intravenous Given 01/09/16 1243)     ____________________________________________   INITIAL IMPRESSION / ASSESSMENT AND PLAN / ED COURSE  Patient's diagnosis is consistent with Left-sided low back pain without sciatica. Patient will be discharged home with prescriptions for naproxen and Flexeril to take as directed. Patient is to follow up with [facility] if symptoms persist past this treatment course. Patient is given ED precautions to return to the ED for any worsening or new symptoms.       ____________________________________________  FINAL CLINICAL IMPRESSION(S) / ED DIAGNOSES  Final diagnoses:  Left-sided low back pain without sciatica      NEW MEDICATIONS STARTED DURING THIS VISIT:  Discharge Medication List as of 01/09/2016 12:32 PM    START taking these medications   Details  cyclobenzaprine (FLEXERIL) 10 MG tablet Take 1 tablet (10 mg total) by mouth 3 (three) times daily as needed for muscle spasms., Starting 01/09/2016, Until Discontinued, Print    naproxen (NAPROSYN) 500 MG tablet Take 1 tablet (500 mg total) by mouth 2 (two) times daily with a meal., Starting 01/09/2016, Until Discontinued, Print             Hope Pigeon, PA-C 01/09/16 1632  Richardean Canal, MD 01/09/16 [phone]

## 2016-01-09 NOTE — ED Notes (Signed)
Pt to ed with c/o left flank pain that started 3 days ago.  Pt reports pain worse with movement and worse with cough.  Denies injury.  Denies pain with urination.

## 2016-01-09 NOTE — ED Notes (Signed)
States he developed pain to left lateral rib/flank area for about 1 week  Denies any trauma   n/v fever or urinary sx's. Pain increases with bending

## 2016-01-09 NOTE — Discharge Instructions (Signed)
Back Pain, Adult °Back pain is very common in adults. The cause of back pain is rarely dangerous and the pain often gets better over time. The cause of your back pain may not be known. Some common causes of back pain include: °· Strain of the muscles or ligaments supporting the spine. °· Wear and tear (degeneration) of the spinal disks. °· Arthritis. °· Direct injury to the back. °For many people, back pain may return. Since back pain is rarely dangerous, most people can learn to manage this condition on their own. °HOME CARE INSTRUCTIONS °Watch your back pain for any changes. The following actions may help to lessen any discomfort you are feeling: °· Remain active. It is stressful on your back to sit or stand in one place for long periods of time. Do not sit, drive, or stand in one place for more than 30 minutes at a time. Take short walks on even surfaces as soon as you are able. Try to increase the length of time you walk each day. °· Exercise regularly as directed by your health care provider. Exercise helps your back heal faster. It also helps avoid future injury by keeping your muscles strong and flexible. °· Do not stay in bed. Resting more than 1-2 days can delay your recovery. °· Pay attention to your body when you bend and lift. The most comfortable positions are those that put less stress on your recovering back. Always use proper lifting techniques, including: °· Bending your knees. °· Keeping the load close to your body. °· Avoiding twisting. °· Find a comfortable position to sleep. Use a firm mattress and lie on your side with your knees slightly bent. If you lie on your back, put a pillow under your knees. °· Avoid feeling anxious or stressed. Stress increases muscle tension and can worsen back pain. It is important to recognize when you are anxious or stressed and learn ways to manage it, such as with exercise. °· Take medicines only as directed by your health care provider. Over-the-counter  medicines to reduce pain and inflammation are often the most helpful. Your health care provider may prescribe muscle relaxant drugs. These medicines help dull your pain so you can more quickly return to your normal activities and healthy exercise. °· Apply ice to the injured area: °· Put ice in a plastic bag. °· Place a towel between your skin and the bag. °· Leave the ice on for 20 minutes, 2-3 times a day for the first 2-3 days. After that, ice and heat may be alternated to reduce pain and spasms. °· Maintain a healthy weight. Excess weight puts extra stress on your back and makes it difficult to maintain good posture. °SEEK MEDICAL CARE IF: °· You have pain that is not relieved with rest or medicine. °· You have increasing pain going down into the legs or buttocks. °· You have pain that does not improve in one week. °· You have night pain. °· You lose weight. °· You have a fever or chills. °SEEK IMMEDIATE MEDICAL CARE IF:  °· You develop new bowel or bladder control problems. °· You have unusual weakness or numbness in your arms or legs. °· You develop nausea or vomiting. °· You develop abdominal pain. °· You feel faint. °  °This information is not intended to replace advice given to you by your health care provider. Make sure you discuss any questions you have with your health care provider. °  °Document Released: 07/08/2005 Document Revised: 07/29/2014 Document Reviewed: 11/09/2013 °Elsevier Interactive Patient Education ©2016 Elsevier   Inc.  Back Exercises If you have pain in your back, do these exercises 2-3 times each day or as told by your doctor. When the pain goes away, do the exercises once each day, but repeat the steps more times for each exercise (do more repetitions). If you do not have pain in your back, do these exercises once each day or as told by your doctor. EXERCISES Single Knee to Chest Do these steps 3-5 times in a row for each leg:  Lie on your back on a firm bed or the floor with  your legs stretched out.  Bring one knee to your chest.  Hold your knee to your chest by grabbing your knee or thigh.  Pull on your knee until you feel a gentle stretch in your lower back.  Keep doing the stretch for 10-30 seconds.  Slowly let go of your leg and straighten it. Pelvic Tilt Do these steps 5-10 times in a row:  Lie on your back on a firm bed or the floor with your legs stretched out.  Bend your knees so they point up to the ceiling. Your feet should be flat on the floor.  Tighten your lower belly (abdomen) muscles to press your lower back against the floor. This will make your tailbone point up to the ceiling instead of pointing down to your feet or the floor.  Stay in this position for 5-10 seconds while you gently tighten your muscles and breathe evenly. Cat-Cow Do these steps until your lower back bends more easily:  Get on your hands and knees on a firm surface. Keep your hands under your shoulders, and keep your knees under your hips. You may put padding under your knees.  Let your head hang down, and make your tailbone point down to the floor so your lower back is round like the back of a cat.  Stay in this position for 5 seconds.  Slowly lift your head and make your tailbone point up to the ceiling so your back hangs low (sags) like the back of a cow.  Stay in this position for 5 seconds. Press-Ups Do these steps 5-10 times in a row:  Lie on your belly (face-down) on the floor.  Place your hands near your head, about shoulder-width apart.  While you keep your back relaxed and keep your hips on the floor, slowly straighten your arms to raise the top half of your body and lift your shoulders. Do not use your back muscles. To make yourself more comfortable, you may change where you place your hands.  Stay in this position for 5 seconds.  Slowly return to lying flat on the floor. Bridges Do these steps 10 times in a row: 1. Lie on your back on a firm  surface. 2. Bend your knees so they point up to the ceiling. Your feet should be flat on the floor. 3. Tighten your butt muscles and lift your butt off of the floor until your waist is almost as high as your knees. If you do not feel the muscles working in your butt and the back of your thighs, slide your feet 1-2 inches farther away from your butt. 4. Stay in this position for 3-5 seconds. 5. Slowly lower your butt to the floor, and let your butt muscles relax. If this exercise is too easy, try doing it with your arms crossed over your chest. Belly Crunches Do these steps 5-10 times in a row: 1. Lie on your back on  a firm bed or the floor with your legs stretched out. 2. Bend your knees so they point up to the ceiling. Your feet should be flat on the floor. 3. Cross your arms over your chest. 4. Tip your chin a little bit toward your chest but do not bend your neck. 5. Tighten your belly muscles and slowly raise your chest just enough to lift your shoulder blades a tiny bit off of the floor. 6. Slowly lower your chest and your head to the floor. Back Lifts Do these steps 5-10 times in a row: 1. Lie on your belly (face-down) with your arms at your sides, and rest your forehead on the floor. 2. Tighten the muscles in your legs and your butt. 3. Slowly lift your chest off of the floor while you keep your hips on the floor. Keep the back of your head in line with the curve in your back. Look at the floor while you do this. 4. Stay in this position for 3-5 seconds. 5. Slowly lower your chest and your face to the floor. GET HELP IF:  Your back pain gets a lot worse when you do an exercise.  Your back pain does not lessen 2 hours after you exercise. If you have any of these problems, stop doing the exercises. Do not do them again unless your doctor says it is okay. GET HELP RIGHT AWAY IF:  You have sudden, very bad back pain. If this happens, stop doing the exercises. Do not do them again  unless your doctor says it is okay.   This information is not intended to replace advice given to you by your health care provider. Make sure you discuss any questions you have with your health care provider.   Document Released: 08/10/2010 Document Revised: 03/29/2015 Document Reviewed: 09/01/2014 Elsevier Interactive Patient Education 2016 Elsevier Inc.  Foot LockerHeat Therapy Heat therapy can help ease sore, stiff, injured, and tight muscles and joints. Heat relaxes your muscles, which may help ease your pain. Heat therapy should only be used on old, pre-existing, or long-lasting (chronic) injuries. Do not use heat therapy unless told by your doctor. HOW TO USE HEAT THERAPY There are several different kinds of heat therapy, including:  Moist heat pack.  Warm water bath.  Hot water bottle.  Electric heating pad.  Heated gel pack.  Heated wrap.  Electric heating pad. GENERAL HEAT THERAPY RECOMMENDATIONS   Do not sleep while using heat therapy. Only use heat therapy while you are awake.  Your skin may turn pink while using heat therapy. Do not use heat therapy if your skin turns red.  Do not use heat therapy if you have new pain.  High heat or long exposure to heat can cause burns. Be careful when using heat therapy to avoid burning your skin.  Do not use heat therapy on areas of your skin that are already irritated, such as with a rash or sunburn. GET HELP IF:   You have blisters, redness, swelling (puffiness), or numbness.  You have new pain.  Your pain is worse. MAKE SURE YOU:  Understand these instructions.  Will watch your condition.  Will get help right away if you are not doing well or get worse.   This information is not intended to replace advice given to you by your health care provider. Make sure you discuss any questions you have with your health care provider.   Document Released: 09/30/2011 Document Revised: 07/29/2014 Document Reviewed: 08/31/2013 Elsevier  Interactive Patient  Education ©2016 Elsevier Inc. ° °

## 2016-04-26 ENCOUNTER — Emergency Department: Payer: Self-pay

## 2016-04-26 ENCOUNTER — Observation Stay
Admission: EM | Admit: 2016-04-26 | Discharge: 2016-04-27 | Disposition: A | Discharge status: Home / Self Care | Payer: Self-pay | Attending: Specialist | Admitting: Specialist

## 2016-04-26 ENCOUNTER — Encounter
Admission: EM | Disposition: A | Discharge status: Home / Self Care | Payer: Self-pay | Source: Home / Self Care | Attending: Student in an Organized Health Care Education/Training Program

## 2016-04-26 DIAGNOSIS — I252 Old myocardial infarction: Secondary | ICD-10-CM | POA: Insufficient documentation

## 2016-04-26 DIAGNOSIS — F1010 Alcohol abuse, uncomplicated: Secondary | ICD-10-CM | POA: Insufficient documentation

## 2016-04-26 DIAGNOSIS — I10 Essential (primary) hypertension: Secondary | ICD-10-CM | POA: Insufficient documentation

## 2016-04-26 DIAGNOSIS — F1210 Cannabis abuse, uncomplicated: Secondary | ICD-10-CM | POA: Insufficient documentation

## 2016-04-26 DIAGNOSIS — R002 Palpitations: Secondary | ICD-10-CM | POA: Insufficient documentation

## 2016-04-26 DIAGNOSIS — Z9119 Patient's noncompliance with other medical treatment and regimen: Secondary | ICD-10-CM | POA: Insufficient documentation

## 2016-04-26 DIAGNOSIS — E7800 Pure hypercholesterolemia, unspecified: Secondary | ICD-10-CM | POA: Insufficient documentation

## 2016-04-26 DIAGNOSIS — R079 Chest pain, unspecified: Secondary | ICD-10-CM

## 2016-04-26 DIAGNOSIS — Z716 Tobacco abuse counseling: Secondary | ICD-10-CM

## 2016-04-26 DIAGNOSIS — R0789 Other chest pain: Principal | ICD-10-CM | POA: Insufficient documentation

## 2016-04-26 DIAGNOSIS — F17210 Nicotine dependence, cigarettes, uncomplicated: Secondary | ICD-10-CM | POA: Insufficient documentation

## 2016-04-26 HISTORY — PX: CARDIAC CATHETERIZATION: SHX172

## 2016-04-26 LAB — URINE DRUG SCREEN, QUALITATIVE (ARMC ONLY)
AMPHETAMINES, UR SCREEN: NOT DETECTED
Barbiturates, Ur Screen: NOT DETECTED
Benzodiazepine, Ur Scrn: NOT DETECTED
CANNABINOID 50 NG, UR ~~LOC~~: POSITIVE — AB
COCAINE METABOLITE, UR ~~LOC~~: NOT DETECTED
MDMA (ECSTASY) UR SCREEN: NOT DETECTED
Methadone Scn, Ur: NOT DETECTED
Opiate, Ur Screen: NOT DETECTED
PHENCYCLIDINE (PCP) UR S: NOT DETECTED
Tricyclic, Ur Screen: NOT DETECTED

## 2016-04-26 LAB — FIBRIN DERIVATIVES D-DIMER (ARMC ONLY): FIBRIN DERIVATIVES D-DIMER (ARMC): 205 (ref 0–499)

## 2016-04-26 LAB — COMPREHENSIVE METABOLIC PANEL
ALK PHOS: 61 U/L (ref 38–126)
ALT: 29 U/L (ref 17–63)
ANION GAP: 8 (ref 5–15)
AST: 24 U/L (ref 15–41)
Albumin: 4 g/dL (ref 3.5–5.0)
BILIRUBIN TOTAL: 0.6 mg/dL (ref 0.3–1.2)
BUN: 11 mg/dL (ref 6–20)
CALCIUM: 8.9 mg/dL (ref 8.9–10.3)
CO2: 23 mmol/L (ref 22–32)
Chloride: 109 mmol/L (ref 101–111)
Creatinine, Ser: 1.02 mg/dL (ref 0.61–1.24)
GFR calc non Af Amer: >60 mL/min (ref >60)
Glucose, Bld: 89 mg/dL (ref 65–99)
Potassium: 3.8 mmol/L (ref 3.5–5.1)
Sodium: 140 mmol/L (ref 135–145)
TOTAL PROTEIN: 7.2 g/dL (ref 6.5–8.1)

## 2016-04-26 LAB — URINALYSIS COMPLETE WITH MICROSCOPIC (ARMC ONLY)
BILIRUBIN URINE: NEGATIVE
Bacteria, UA: NONE SEEN
GLUCOSE, UA: NEGATIVE mg/dL
KETONES UR: NEGATIVE mg/dL
Leukocytes, UA: NEGATIVE
NITRITE: NEGATIVE
Protein, ur: NEGATIVE mg/dL
SPECIFIC GRAVITY, URINE: 1.018 (ref 1.005–1.030)
pH: 7 (ref 5.0–8.0)

## 2016-04-26 LAB — CBC
HEMATOCRIT: 43.5 % (ref 40.0–52.0)
HEMOGLOBIN: 15.4 g/dL (ref 13.0–18.0)
MCH: 31.8 pg (ref 26.0–34.0)
MCHC: 35.3 g/dL (ref 32.0–36.0)
MCV: 90.3 fL (ref 80.0–100.0)
Platelets: 160 10*3/uL (ref 150–440)
RBC: 4.83 MIL/uL (ref 4.40–5.90)
RDW: 14.3 % (ref 11.5–14.5)
WBC: 9.8 10*3/uL (ref 3.8–10.6)

## 2016-04-26 LAB — TROPONIN I: Troponin I: <0.03 ng/mL (ref <0.03)

## 2016-04-26 LAB — TSH: TSH: 2.554 u[IU]/mL (ref 0.350–4.500)

## 2016-04-26 SURGERY — LEFT HEART CATH AND CORONARY ANGIOGRAPHY
Anesthesia: Moderate Sedation

## 2016-04-26 MED ORDER — FENTANYL CITRATE (PF) 100 MCG/2ML IJ SOLN
INTRAMUSCULAR | Status: AC
  Filled 2016-04-26: qty 2

## 2016-04-26 MED ORDER — FENTANYL CITRATE (PF) 100 MCG/2ML IJ SOLN
INTRAMUSCULAR | Status: DC | PRN
  Administered 2016-04-26: 50 ug via INTRAVENOUS

## 2016-04-26 MED ORDER — SODIUM CHLORIDE 0.9% FLUSH: 3.0000 mL | Freq: Two times a day (BID) | INTRAVENOUS | Status: DC

## 2016-04-26 MED ORDER — ACETAMINOPHEN 325 MG PO TABS: 650.0000 mg | ORAL_TABLET | Freq: Four times a day (QID) | ORAL | Status: DC | PRN

## 2016-04-26 MED ORDER — METOPROLOL TARTRATE 25 MG PO TABS
25.0000 mg | ORAL_TABLET | Freq: Two times a day (BID) | ORAL | Status: DC
  Administered 2016-04-26 – 2016-04-27 (×3): 25 mg via ORAL
  Filled 2016-04-26 (×3): qty 1

## 2016-04-26 MED ORDER — IOPAMIDOL (ISOVUE-300) INJECTION 61%
INTRAVENOUS | Status: DC | PRN
  Administered 2016-04-26: 90 mL via INTRA_ARTERIAL

## 2016-04-26 MED ORDER — SODIUM CHLORIDE 0.9 % IV SOLN: 250.0000 mL | INTRAVENOUS | Status: DC | PRN

## 2016-04-26 MED ORDER — SODIUM CHLORIDE 0.9 % WEIGHT BASED INFUSION: 3.0000 mL/kg/h | INTRAVENOUS | Status: DC

## 2016-04-26 MED ORDER — ACETAMINOPHEN 650 MG RE SUPP: 650.0000 mg | Freq: Four times a day (QID) | RECTAL | Status: DC | PRN

## 2016-04-26 MED ORDER — SODIUM CHLORIDE 0.9% FLUSH: 3.0000 mL | INTRAVENOUS | Status: DC | PRN

## 2016-04-26 MED ORDER — FENTANYL CITRATE (PF) 100 MCG/2ML IJ SOLN
100.0000 ug | INTRAMUSCULAR | Status: DC | PRN
  Administered 2016-04-26: 100 ug via INTRAVENOUS
  Filled 2016-04-26: qty 2

## 2016-04-26 MED ORDER — NITROGLYCERIN 2 % TD OINT
1.0000 [in_us] | TOPICAL_OINTMENT | Freq: Four times a day (QID) | TRANSDERMAL | Status: DC
  Administered 2016-04-26 – 2016-04-27 (×2): 1 [in_us] via TOPICAL
  Filled 2016-04-26 (×2): qty 1

## 2016-04-26 MED ORDER — ASPIRIN 81 MG PO CHEW
324.0000 mg | CHEWABLE_TABLET | Freq: Once | ORAL | Status: AC
  Administered 2016-04-26: 324 mg via ORAL
  Filled 2016-04-26: qty 4

## 2016-04-26 MED ORDER — MIDAZOLAM HCL 2 MG/2ML IJ SOLN
INTRAMUSCULAR | Status: DC | PRN
  Administered 2016-04-26 (×2): 1 mg via INTRAVENOUS

## 2016-04-26 MED ORDER — AMLODIPINE BESYLATE 5 MG PO TABS
ORAL_TABLET | ORAL | Status: AC
  Filled 2016-04-26: qty 1

## 2016-04-26 MED ORDER — SODIUM CHLORIDE 0.9% FLUSH
3.0000 mL | Freq: Two times a day (BID) | INTRAVENOUS | Status: DC
  Administered 2016-04-26 – 2016-04-27 (×2): 3 mL via INTRAVENOUS

## 2016-04-26 MED ORDER — AMLODIPINE BESYLATE 2.5 MG PO TABS
ORAL_TABLET | ORAL | Status: DC | PRN
  Administered 2016-04-26: 5 mg via ORAL

## 2016-04-26 MED ORDER — ONDANSETRON HCL 4 MG/2ML IJ SOLN: 4.0000 mg | Freq: Four times a day (QID) | INTRAMUSCULAR | Status: DC | PRN

## 2016-04-26 MED ORDER — ASPIRIN EC 81 MG PO TBEC
81.0000 mg | DELAYED_RELEASE_TABLET | Freq: Every day | ORAL | Status: DC
  Administered 2016-04-27: 81 mg via ORAL
  Filled 2016-04-26: qty 1

## 2016-04-26 MED ORDER — MIDAZOLAM HCL 2 MG/2ML IJ SOLN
INTRAMUSCULAR | Status: AC
  Filled 2016-04-26: qty 2

## 2016-04-26 MED ORDER — ONDANSETRON HCL 4 MG PO TABS: 4.0000 mg | ORAL_TABLET | Freq: Four times a day (QID) | ORAL | Status: DC | PRN

## 2016-04-26 MED ORDER — HEPARIN (PORCINE) IN NACL 2-0.9 UNIT/ML-% IJ SOLN
INTRAMUSCULAR | Status: AC
  Filled 2016-04-26: qty 1000

## 2016-04-26 MED ORDER — SODIUM CHLORIDE 0.9 % WEIGHT BASED INFUSION: 1.0000 mL/kg/h | INTRAVENOUS | Status: DC

## 2016-04-26 MED ORDER — SODIUM CHLORIDE 0.9% FLUSH
3.0000 mL | Freq: Two times a day (BID) | INTRAVENOUS | Status: DC
  Administered 2016-04-26 – 2016-04-27 (×3): 3 mL via INTRAVENOUS

## 2016-04-26 MED ORDER — ENOXAPARIN SODIUM 40 MG/0.4ML ~~LOC~~ SOLN
40.0000 mg | SUBCUTANEOUS | Status: DC
  Filled 2016-04-26: qty 0.4

## 2016-04-26 MED ORDER — SODIUM CHLORIDE 0.9 % WEIGHT BASED INFUSION
3.0000 mL/kg/h | INTRAVENOUS | Status: AC
  Administered 2016-04-26: 3 mL/kg/h via INTRAVENOUS

## 2016-04-26 MED ORDER — ASPIRIN 81 MG PO CHEW: 81.0000 mg | CHEWABLE_TABLET | ORAL | Status: DC

## 2016-04-26 MED ORDER — NITROGLYCERIN 2 % TD OINT
TOPICAL_OINTMENT | TRANSDERMAL | Status: AC
  Filled 2016-04-26: qty 1

## 2016-04-26 MED ORDER — NITROGLYCERIN 2 % TD OINT
TOPICAL_OINTMENT | TRANSDERMAL | Status: DC | PRN
  Administered 2016-04-26: 1 [in_us] via TOPICAL

## 2016-04-26 MED ORDER — ACETAMINOPHEN 325 MG PO TABS
650.0000 mg | ORAL_TABLET | ORAL | Status: DC | PRN
  Administered 2016-04-26: 650 mg via ORAL
  Filled 2016-04-26: qty 2

## 2016-04-26 MED ORDER — NITROGLYCERIN 0.4 MG SL SUBL
0.4000 mg | SUBLINGUAL_TABLET | SUBLINGUAL | Status: DC | PRN
  Administered 2016-04-26: 0.4 mg via SUBLINGUAL
  Filled 2016-04-26: qty 1

## 2016-04-26 SURGICAL SUPPLY — 10 items
CATH 5FR JL4 DIAGNOSTIC (CATHETERS) ×1 IMPLANT
CATH 5FR JR4 DIAGNOSTIC (CATHETERS) ×2 IMPLANT
CATH 5FR PIGTAIL DIAGNOSTIC (CATHETERS) ×1 IMPLANT
DEVICE CLOSURE MYNXGRIP 5F (Vascular Products) ×2 IMPLANT
KIT MANI 3VAL PERCEP (MISCELLANEOUS) ×3 IMPLANT
NDL PERC 18GX7CM (NEEDLE) IMPLANT
NEEDLE PERC 18GX7CM (NEEDLE) ×3 IMPLANT
PACK CARDIAC CATH (CUSTOM PROCEDURE TRAY) ×3 IMPLANT
SHEATH AVANTI 5FR X 11CM (SHEATH) ×2 IMPLANT
WIRE EMERALD 3MM-J .035X150CM (WIRE) ×2 IMPLANT

## 2016-04-26 NOTE — ED Provider Notes (Signed)
[facility] Emergency Department Provider Note    First MD Initiated Contact with Patient 04/26/16 [id number]     (approximate)  I have reviewed the triage vital signs and the nursing notes.   HISTORY  Chief Complaint Chest Pain    HPI Jonathan Caldwell is a 40 y.o. male with history of high blood pressure as well as "mild heart attack" 7 years ago presents with chest pain over mid sternum without radiation that awoke him from sleep. There is no associated diaphoresis or nausea. No pain worsening with movement. Pain is not worse with lying flat. No recent fevers. Does feel short of breath. Has also had left lower extremity pain for 1 year. States that this pain feels similar to his previous "heart attack". Says he does still smoke a pack per day. Has a history of high blood pressure as well as high cholesterol. Also has significant family history of early cardiac disease.   Past Medical History:  Diagnosis Date  . Chronic knee pain   . Hypertension     Patient Active Problem List   Diagnosis Date Noted  . Chest pain 04/26/2016  . Palpitations 04/26/2016  . Tobacco abuse counseling 04/26/2016  . Alcohol abuse 04/26/2016  . Marijuana abuse 04/26/2016  . 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 ALCOHOL ABUSE, HX OF 05/09/2009  . TOBACCO ABUSE, HX OF 05/09/2009  . CHEST PAIN, ATYPICAL, HX OF 05/09/2009    No past surgical history on file.  Prior to Admission medications   Medication Sig Start Date End Date Taking? Authorizing Provider  amLODipine (NORVASC) 5 MG tablet Take 5 mg by mouth daily.   Yes Historical Provider, MD    Allergies Review of patient's allergies indicates no known allergies.  No family history on file.  Social History Social History  Substance Use Topics  . Smoking status: Current Every Day Smoker    Packs/day: 0.25    Types: Cigarettes  . Smokeless tobacco: Not on file  . Alcohol use Yes     Comment: occ    Review of Systems Patient  denies headaches, rhinorrhea, blurry vision, numbness, shortness of breath, chest pain, edema, cough, abdominal pain, nausea, vomiting, diarrhea, dysuria, fevers, rashes or hallucinations unless otherwise stated above in HPI. ____________________________________________   PHYSICAL EXAM:  VITAL SIGNS: Vitals:   04/26/16 0930 04/26/16 1115  BP: 120/78 (!) 132/91  Pulse: (!) 58 63  Resp: 18   Temp:      Constitutional: Alert and oriented. Well appearing and in no acute distress. Eyes: Conjunctivae are normal. PERRL. EOMI. Head: Atraumatic. Nose: No congestion/rhinnorhea. Mouth/Throat: Mucous membranes are moist.  Oropharynx non-erythematous. Neck: No stridor. Painless ROM. No cervical spine tenderness to palpation Hematological/Lymphatic/Immunilogical: No cervical lymphadenopathy. Cardiovascular: Normal rate, regular rhythm.  Pain with palpation of sternum, no erythema or ecchymosis. Grossly normal heart sounds.  Good peripheral circulation. Respiratory: Normal respiratory effort.  No retractions. Lungs CTAB. Gastrointestinal: Soft and nontender. No distention. No abdominal bruits. No CVA tenderness. Genitourinary:  Musculoskeletal: No lower extremity tenderness nor edema.  No joint effusions. Neurologic:  Normal speech and language. No gross focal neurologic deficits are appreciated. No gait instability. Skin:  Skin is warm, dry and intact. No rash noted. Psychiatric: Mood and affect are normal. Speech and behavior are normal.  ____________________________________________   LABS (all labs ordered are listed, but only abnormal results are displayed)  Results for orders placed or performed during the hospital encounter of 04/26/16 (from the past 24 hour(s))  CBC  Status: None   Collection Time: 04/26/16  8:43 AM  Result Value Ref Range   WBC 9.8 3.8 - 10.6 K/uL   RBC 4.83 4.40 - 5.90 MIL/uL   Hemoglobin 15.4 13.0 - 18.0 g/dL   HCT 16.1 09.6 - 04.5 %   MCV 90.3 80.0 - 100.0  fL   MCH 31.8 26.0 - 34.0 pg   MCHC 35.3 32.0 - 36.0 g/dL   RDW 40.9 81.1 - 91.4 %   Platelets 160 150 - 440 K/uL  Comprehensive metabolic panel     Status: None   Collection Time: 04/26/16  8:43 AM  Result Value Ref Range   Sodium 140 135 - 145 mmol/L   Potassium 3.8 3.5 - 5.1 mmol/L   Chloride 109 101 - 111 mmol/L   CO2 23 22 - 32 mmol/L   Glucose, Bld 89 65 - 99 mg/dL   BUN 11 6 - 20 mg/dL   Creatinine, Ser 7.82 0.61 - 1.24 mg/dL   Calcium 8.9 8.9 - 95.6 mg/dL   Total Protein 7.2 6.5 - 8.1 g/dL   Albumin 4.0 3.5 - 5.0 g/dL   AST 24 15 - 41 U/L   ALT 29 17 - 63 U/L   Alkaline Phosphatase 61 38 - 126 U/L   Total Bilirubin 0.6 0.3 - 1.2 mg/dL   GFR calc non Af Amer >60 >60 mL/min   GFR calc Af Amer >60 >60 mL/min   Anion gap 8 5 - 15  Troponin I     Status: None   Collection Time: 04/26/16  8:43 AM  Result Value Ref Range   Troponin I <0.03 <0.03 ng/mL  Fibrin derivatives D-Dimer ([facility] only)     Status: None   Collection Time: 04/26/16  8:43 AM  Result Value Ref Range   Fibrin derivatives D-dimer ([facility]) 205 0 - 499   ____________________________________________  EKG My review and personal interpretation at Time: 8:14   Indication: chest pain  Rate: 70  Rhythm: nsr Axis: normal Other: twave inversion in inferolateral distribution, Wellens T wave in V3 ____________________________________________  RADIOLOGY  I personally reviewed all radiographic images ordered to evaluate for the above acute complaints and reviewed radiology reports and findings.  These findings were personally discussed with the patient.  Please see medical record for radiology report.  ____________________________________________   PROCEDURES  Procedure(s) performed: none    Critical Care performed: no ____________________________________________   INITIAL IMPRESSION / ASSESSMENT AND PLAN / ED COURSE  Pertinent labs & imaging results that were available during my care of the patient were  reviewed by me and considered in my medical decision making (see chart for details).  DDX: ACS, pericarditis, esophagitis, boerhaaves, pe, dissection, pna, bronchitis, costochondritis   Jonathan Caldwell is a 40 y.o. who presents to the ED with Acute chest pain that woke him from sleep. Patient with multiple risk factors for ACS. Initial EKG is concerning with a pattern consistent with a type A Wellens syndrome the patient currently has chest pain. Breath sounds are equal bilaterally. Not consistent with dissection. Patient the risk Wells. We'll further risk stratify would d-dimer.  The patient will be placed on continuous pulse oximetry and telemetry for monitoring.  Laboratory evaluation will be sent to evaluate for the above complaints.     Clinical Course  Comment By Time  Patient now chest pain-free. D-dimer is negative. We'll repeat EKG. Willy Eddy, MD 10/06 1017  Repeat EKG at time 1019 shows interval changes and T-wave morphology  in aVR and V3. Willy EddyPatrick Bonnell Placzek, MD 10/06 1024  Spoke with Dr. Lady GaryFath regarding patient's presentation and symptoms.  He agrees to evaluate patient for possible cath. Willy EddyPatrick Virgilio Broadhead, MD 10/06 1030   Patient admitted to hospitalist for further evaluation and management.  ____________________________________________   FINAL CLINICAL IMPRESSION(S) / ED DIAGNOSES  Final diagnoses:  Chest pain, unspecified type      NEW MEDICATIONS STARTED DURING THIS VISIT:  New Prescriptions   No medications on file     Note:  This document was prepared using Dragon voice recognition software and may include unintentional dictation errors.    Willy EddyPatrick Demarco Bacci, MD 04/26/16 [phone]

## 2016-04-26 NOTE — Consult Note (Signed)
[facility] CARDIOLOGY [facility]  CARDIOLOGY CONSULT NOTE  Patient ID: Jonathan Caldwell MRN: [id number] DOB/AGE: 29-Mar-1976 40 y.o.  Admit date: 04/26/2016 Referring Physician Dr. Roxan Hockey Primary Physician [facility] Primary Cardiologist no Reason for Consultation Unstable angina  HPI: Patient is a 40 year old male with history of hypertension and tobacco abuse he presented to the emergency room with approximate 6-7 hours of midsternal chest pain. 87 awoke him from sleep. He states he is compliant with his medications. Initial troponin emergency room was normal. Renal function was normal. Electrocardiogram showed biphasic and inverted T waves in the anterolateral leads. He was given nitroglycerin and pain relief with some improvement in his EKG changes. He is currently essentially pain free. Again he had pain for 7-8 hours prior to presentation with a normal troponin. Risk factors include tobacco abuse and hypertension. He currently denies illicit drug use, urine tox screen pending. He is currently hemodynamically stable.  Review of Systems  Constitutional: Negative.   HENT: Negative.   Eyes: Negative.   Respiratory: Negative.   Cardiovascular: Positive for chest pain.  Gastrointestinal: Negative.   Genitourinary: Negative.   Musculoskeletal: Negative.   Skin: Negative.   Neurological: Negative.   Endo/Heme/Allergies: Negative.   Psychiatric/Behavioral: Negative.     Past Medical History:  Diagnosis Date  . Chronic knee pain   . Hypertension     No family history on file.  Social History   Social History  . Marital status: Married    Spouse name: N/A  . Number of children: N/A  . Years of education: N/A   Occupational History  . Not on file.   Social History Main Topics  . Smoking status: Current Every Day Smoker    Packs/day: 0.25    Types: Cigarettes  . Smokeless tobacco: Not on file  . Alcohol use Yes     Comment: occ  . Drug use:      Types: Marijuana  . Sexual activity: Not on file   Other Topics Concern  . Not on file   Social History Narrative  . No narrative on file    No past surgical history on file.    (Not in a hospital admission)  Physical Exam: Blood pressure 120/78, pulse (!) 58, temperature 98.5 F (36.9 C), temperature source Oral, resp. rate 18, height 5\' 8"  (1.727 m), weight 103 kg (227 lb), SpO2 93 %.   Wt Readings from Last 1 Encounters:  04/26/16 103 kg (227 lb)     General appearance: alert and cooperative Resp: clear to auscultation bilaterally Cardio: regular rate and rhythm GI: soft, non-tender; bowel sounds normal; no masses,  no organomegaly Pulses: 2+ and symmetric Neurologic: Grossly normal  Labs:   Lab Results  Component Value Date   WBC 9.8 04/26/2016   HGB 15.4 04/26/2016   HCT 43.5 04/26/2016   MCV 90.3 04/26/2016   PLT 160 04/26/2016    Recent Labs Lab 04/26/16 0843  NA 140  K 3.8  CL 109  CO2 23  BUN 11  CREATININE 1.02  CALCIUM 8.9  PROT 7.2  BILITOT 0.6  ALKPHOS 61  ALT 29  AST 24  GLUCOSE 89   Lab Results  Component Value Date   CKTOTAL 170 04/10/2009   CKMB 1.2 04/10/2009   TROPONINI <0.03 04/26/2016      Radiology:No acute cardiopulmonary disease on chest x-ray. Chest CT revealed no acute intra-abdominal or pelvic findings normal appendix. EKG: Sinus rhythm with anterolateral T-wave inversion.  ASSESSMENT AND PLAN:  Patient with risk factors for coronary disease including current tobacco use and hypertension who presents with 10 out 10 chest pain lasting 6-7 hours prior to presentation relieved with narcotics and nitroglycerin. EKG shows transient T-wave changes in the anterolateral leads. Troponin negative thus far. Given chest pain and EKG changes which were transient, we'll need to proceed with left cardiac catheterization to evaluate coronary anatomy. Further workup will depend on the results. Will start on aspirin and topical nitrates.  We'll proceed with heparinization if there are further EKG changes and/or post cardiac catheterization. We'll proceed with urgent cardiac catheterization as soon as possible today. Signed: Dalia HeadingFATH,Riki Gehring A. MD, [facility] CenterFACC 04/26/2016, 10:58 AM

## 2016-04-26 NOTE — Progress Notes (Signed)
[facility]  SUBJECTIVE: atypical chest pain   Vitals:   04/26/16 1256 04/26/16 1348 04/26/16 1400 04/26/16 1439  BP:  (!) 160/93    Pulse:  (!) 59 (!) 58   Resp:  16 14   Temp:  98.5 F (36.9 C)    TempSrc:  Oral    SpO2: 97% 98% 96% 97%  Weight:      Height:        Intake/Output Summary (Last 24 hours) at 04/26/16 1537 Last data filed at 04/26/16 1255  Gross per 24 hour  Intake                0 ml  Output              225 ml  Net             -225 ml    LABS: Basic Metabolic Panel:  Recent Labs  16/04/9609/06/17 0843  NA 140  K 3.8  CL 109  CO2 23  GLUCOSE 89  BUN 11  CREATININE 1.02  CALCIUM 8.9   Liver Function Tests:  Recent Labs  04/26/16 0843  AST 24  ALT 29  ALKPHOS 61  BILITOT 0.6  PROT 7.2  ALBUMIN 4.0   No results for input(s): LIPASE, AMYLASE in the last 72 hours. CBC:  Recent Labs  04/26/16 0843  WBC 9.8  HGB 15.4  HCT 43.5  MCV 90.3  PLT 160   Cardiac Enzymes:  Recent Labs  04/26/16 0843  TROPONINI <0.03   BNP: Invalid input(s): POCBNP D-Dimer: No results for input(s): DDIMER in the last 72 hours. Hemoglobin A1C: No results for input(s): HGBA1C in the last 72 hours. Fasting Lipid Panel: No results for input(s): CHOL, HDL, LDLCALC, TRIG, CHOLHDL, LDLDIRECT in the last 72 hours. Thyroid Function Tests: No results for input(s): TSH, T4TOTAL, T3FREE, THYROIDAB in the last 72 hours.  Invalid input(s): FREET3 Anemia Panel: No results for input(s): VITAMINB12, FOLATE, FERRITIN, TIBC, IRON, RETICCTPCT in the last 72 hours.   Physical Exam: Blood pressure (!) 160/93, pulse (!) 58, temperature 98.5 F (36.9 C), temperature source Oral, resp. rate 14, height 5\' 8"  (1.727 m), weight 103 kg (227 lb), SpO2 97 %.   Wt Readings from Last 1 Encounters:  04/26/16 103 kg (227 lb)     General appearance: alert and cooperative Resp: clear to auscultation bilaterally Cardio: regular rate and  rhythm Extremities: extremities normal, atraumatic, no cyanosis or edema Neurologic: Grossly normal  TELEMETRY: Reviewed telemetry pt in nsr :  ASSESSMENT AND PLAN:  Active Problems:   Chest pain-does not appear to be ischemic. Cath showed normal coronary arteries with normal lv funciton.    Palpitations   Tobacco abuse counseling   Alcohol abuse   Marijuana abuse    Dalia HeadingFATH,Shanese Riemenschneider A., MD, [facility]isFACC 04/26/2016 3:37 PM

## 2016-04-26 NOTE — ED Triage Notes (Signed)
Pt arrives from home with reports of mid sternal chest pain that began this am  Pt states  "It woke me up"   11/10 pain reported

## 2016-04-26 NOTE — Clinical Social Work Note (Signed)
MSW received consult for patient having trouble getting medications, case manager can assist with this, please consult case management.  MSW to sign off please reconsult if other social work needs arise.  Ervin KnackEric R. Leahanna Buser, MSW [phone]  Mon-Fri 8a-4:30p 04/26/2016 5:30 PM

## 2016-04-26 NOTE — H&P (Signed)
[facility] - [place] at [facility]   PATIENT NAME: Jonathan Caldwell    MR#:  [id number]  DATE OF BIRTH:  [birth year]  DATE OF ADMISSION:  04/26/2016  PRIMARY CARE PHYSICIAN: No PCP Per Patient   REQUESTING/REFERRING PHYSICIAN:   CHIEF COMPLAINT:   Chief Complaint  Patient presents with  . Chest Pain    HISTORY OF PRESENT ILLNESS: Jonathan HookHollis Mendenhall  is a 40 y.o. male with a known history of Chronic knee pain, essential hypertension, who presents to the hospital with complaints of severe chest pain. When the patient, he was doing well up until midnight last night when he started noticing midsternal chest pain. Pain was described as achiness 2 out of 10 by intensity, constant. He went to sleep. However, whenever patient woke up, his pain was severe, 11 out of 10 by intensity, sharp, achy deep pain, not associated with lightheadedness, dizziness or shortness of breath, but patient noted palpitations, and pain worsened when he walked. He decided to come to emergency room for further evaluation and emergency room he was noted to have T depressions in V3, which were new from before. Hospitalist services were contacted for admission.   Patient admitted for snoring, sleepiness the daytime. Patient's wife tells me that he stops breathing intermittently at nighttime while snores      . Diagnosis Date  . Chronic knee pain   . Hypertension     PAST SURGICAL HISTORY: No past surgical history on file.  SOCIAL HISTORY:  Social History  Substance Use Topics  . Smoking status: Current Every Day Smoker    Packs/day: 0.25    Types: Cigarettes  . Smokeless tobacco: Not on file  . Alcohol use Yes     Comment: occ    FAMILY HISTORY: No family history on file.  DRUG ALLERGIES: No Known Allergies  Review of Systems  Constitutional: Negative for chills, fever and weight loss.  HENT: Negative for congestion.   Eyes: Negative for blurred vision and double vision.   Respiratory: Negative for cough, sputum production, shortness of breath and wheezing.   Cardiovascular: Positive for chest pain and palpitations. Negative for orthopnea, leg swelling and PND.  Gastrointestinal: Negative for abdominal pain, blood in stool, constipation, diarrhea, nausea and vomiting.  Genitourinary: Negative for dysuria, frequency, hematuria and urgency.  Musculoskeletal: Negative for falls.  Neurological: Negative for dizziness, tremors, focal weakness and headaches.  Endo/Heme/Allergies: Does not bruise/bleed easily.  Psychiatric/Behavioral: Negative for depression. The patient does not have insomnia.     MEDICATIONS AT HOME:  Prior to Admission medications   Medication Sig Start Date End Date Taking? Authorizing Provider  amLODipine (NORVASC) 5 MG tablet Take 5 mg by mouth daily.   Yes Historical Provider, MD      PHYSICAL EXAMINATION:   VITAL SIGNS: Blood pressure (!) 132/91, pulse 63, temperature 98.5 F (36.9 C), temperature source Oral, resp. rate 18, height 5\' 8"  (1.727 m), weight 103 kg (227 lb), SpO2 93 %.  GENERAL:  40 y.o.-year-old patient lying in the bed with no acute distress.  EYES: Pupils equal, round, reactive to light and accommodation. No scleral icterus. Extraocular muscles intact.  HEENT: Head atraumatic, normocephalic. Oropharynx and nasopharynx clear.  NECK:  Supple, no jugular venous distention. No thyroid enlargement, no tenderness.  LUNGS: Normal breath sounds bilaterally, no wheezing, rales,rhonchi or crepitation. No use of accessory muscles of respiration.  CARDIOVASCULAR: S1, S2 normal. No murmurs, rubs, or gallops. Some discomfort on palpation of the chest anteriorly in  mid sternal area ABDOMEN: Soft, nontender, nondistended. Bowel sounds present. No organomegaly or mass.  EXTREMITIES: No pedal edema, cyanosis, or clubbing.  NEUROLOGIC: Cranial nerves II through XII are intact. Muscle strength 5/5 in all extremities. Sensation intact.  Gait not checked.  PSYCHIATRIC: The patient is alert and oriented x 3.  SKIN: No obvious rash, lesion, or ulcer.   LABORATORY PANEL:   CBC  Recent Labs Lab 04/26/16 0843  WBC 9.8  HGB 15.4  HCT 43.5  PLT 160  MCV 90.3  MCH 31.8  MCHC 35.3  RDW 14.3   ------------------------------------------------------------------------------------------------------------------  Chemistries   Recent Labs Lab 04/26/16 0843  NA 140  K 3.8  CL 109  CO2 23  GLUCOSE 89  BUN 11  CREATININE 1.02  CALCIUM 8.9  AST 24  ALT 29  ALKPHOS 61  BILITOT 0.6   ------------------------------------------------------------------------------------------------------------------  Cardiac Enzymes  Recent Labs Lab 04/26/16 0843  TROPONINI <0.03   ------------------------------------------------------------------------------------------------------------------  RADIOLOGY: Dg Chest Portable 1 View  Result Date: 04/26/2016 CLINICAL DATA:  LEFT-sided chest pain was short of breath EXAM: PORTABLE CHEST 1 VIEW COMPARISON:  04/28/2015 FINDINGS: Normal mediastinum and cardiac silhouette. Normal pulmonary vasculature. No evidence of effusion, infiltrate, or pneumothorax. No acute bony abnormality. IMPRESSION: No acute cardiopulmonary process. Electronically Signed   By: [name] M.D.   On: 04/26/2016 09:18    EKG: Orders placed or performed during the hospital encounter of 04/26/16  . EKG 12-Lead  . EKG 12-Lead  . ED EKG  . ED EKG  . ED EKG  . ED EKG  . EKG 12-Lead  . EKG 12-Lead  EKG in emergency room revealed normal sinus rhythm at 72 bpm, sinus arrhythmia, T depressions in inferior leads, biphasic T, new in V3, T depressions in anterolateral leads   IMPRESSION AND PLAN:  Active Problems:   Chest pain   Palpitations   Tobacco abuse counseling   Alcohol abuse   Marijuana abuse #1. Chest pain, admit patient to medical floor, initiate him on nitroglycerin topically, metoprolol,  aspirin, Lovenox subcutaneously, check cardiac enzymes, sensory, good cardiologist involved for further recommendations, stress test versus cardiac catheterization #2, palpitations, follow patient on telemetry, get TSH, urine drug screen, continue metoprolol #3 tobacco abuse counseling, discussed this patient for approximately 4 minutes, nicotine replacement therapy is initiated, he is agreeable #4 alcohol abuse, which were withdrawal #5. Marijuana abuse, get urine drug screen to rule out other drug abuse, counseling was provided   All the records are reviewed and case discussed with ED provider. Management plans discussed with the patient, family and they are in agreement.  CODE STATUS: Code Status History    This patient does not have a recorded code status. Please follow your organizational policy for patients in this situation.       TOTAL TIME TAKING CARE OF THIS PATIENT:  50 minutes     Dorleen Kissel M.D on 04/26/2016 at 11:39 AM  Between 7am to 6pm - Pager - [phone] After 6pm go to [email or web address] - password [email or web address] [facility]  [place] [facility]  Office  [phone]  CC: Primary care physician; No PCP Per Patient

## 2016-04-26 NOTE — Progress Notes (Signed)
Patient back from cath, site WDL, no complaints. Wife at bedside. Patient sitting up, eating dinner. Will continue to monitor.

## 2016-04-26 NOTE — ED Notes (Signed)
Informed Dr. Roxan Hockeyobinson pt is too sedated to transport to XR for 2view. Order changed from 2 view to portable.

## 2016-04-27 LAB — CBC
HCT: 43.9 % (ref 40.0–52.0)
HEMOGLOBIN: 14.9 g/dL (ref 13.0–18.0)
MCH: 31 pg (ref 26.0–34.0)
MCHC: 33.9 g/dL (ref 32.0–36.0)
MCV: 91.5 fL (ref 80.0–100.0)
PLATELETS: 149 10*3/uL — AB (ref 150–440)
RBC: 4.8 MIL/uL (ref 4.40–5.90)
RDW: 14.3 % (ref 11.5–14.5)
WBC: 10.6 10*3/uL (ref 3.8–10.6)

## 2016-04-27 LAB — GLUCOSE, CAPILLARY
GLUCOSE-CAPILLARY: 91 mg/dL (ref 65–99)
Glucose-Capillary: 100 mg/dL — ABNORMAL HIGH (ref 65–99)

## 2016-04-27 LAB — BASIC METABOLIC PANEL
ANION GAP: 5 (ref 5–15)
BUN: 15 mg/dL (ref 6–20)
CALCIUM: 8.4 mg/dL — AB (ref 8.9–10.3)
CO2: 24 mmol/L (ref 22–32)
CREATININE: 1.17 mg/dL (ref 0.61–1.24)
Chloride: 109 mmol/L (ref 101–111)
Glucose, Bld: 98 mg/dL (ref 65–99)
Potassium: 3.8 mmol/L (ref 3.5–5.1)
SODIUM: 138 mmol/L (ref 135–145)

## 2016-04-27 LAB — TROPONIN I: Troponin I: <0.03 ng/mL (ref <0.03)

## 2016-04-27 LAB — HEMOGLOBIN A1C
Hgb A1c MFr Bld: 5.4 % (ref 4.8–5.6)
Mean Plasma Glucose: 108 mg/dL

## 2016-04-27 MED ORDER — METOPROLOL TARTRATE 25 MG PO TABS: 25.0000 mg | ORAL_TABLET | Freq: Two times a day (BID) | ORAL | 1 refills | Status: DC

## 2016-04-27 NOTE — Progress Notes (Addendum)
Patient given discharge teaching and paperwork regarding medications, diet, follow-up appointments and activity. Patient understanding verbalized. No complaints at this time. IV and telemetry discontinued prior to leaving. Skin assessment as previously charted and vitals are stable; on room air. Patient being discharged to home. Family present during discharge teaching. No further needs by Care Management - d/c med is on [facility] $4 list. Prescription given to patient.   Admission documentation states that patient has jewelry locked in Security. No key/slip found on chart. Patient denies having anything sent to the safe. Called Security to confirm.

## 2016-04-29 ENCOUNTER — Encounter: Payer: Self-pay | Admitting: Cardiology

## 2016-05-11 NOTE — Discharge Summary (Signed)
[facility] - [place] at [facility]   PATIENT NAME: Jonathan Caldwell    MR#:  [id number]  DATE OF BIRTH:  14-Jun-1976  DATE OF ADMISSION:  04/26/2016 ADMITTING PHYSICIAN: Dalia Heading, MD  DATE OF DISCHARGE: 04/27/2016 12:35 PM  PRIMARY CARE PHYSICIAN: No PCP Per Patient    ADMISSION DIAGNOSIS:  Chest pain, unspecified type [R07.9]  DISCHARGE DIAGNOSIS:  Active Problems:   Chest pain   Palpitations   Tobacco abuse counseling   Alcohol abuse   Marijuana abuse   SECONDARY DIAGNOSIS:   Past Medical History:  Diagnosis Date  . Chronic knee pain   . Hypertension     HOSPITAL COURSE:   40 year old male with past medical history of hypertension, osteoarthritis, tobacco abuse, alcohol abuse who presented to the hospital with chest pain.   1. Chest pain-patient did have risk factors and was admitted to hospital under observation. His cardiac markers were negative.  -She was seen by cardiology and given his EKG changes he underwent a cardiac catheterization which showed normal coronaries with normal LV function. He is currently chest pain-free and hemodynamically stable and therefore being discharged home.  2. Essential hypertension-patient's blood pressures were somewhat elevated given his medical noncompliance. -We'll resume his Norvasc and metoprolol.  DISCHARGE CONDITIONS:   Stable  CONSULTS OBTAINED:  Treatment Team:  Dalia Heading, MD  DRUG ALLERGIES:  No Known Allergies  DISCHARGE MEDICATIONS:     Medication List    STOP taking these medications   amLODipine 5 MG tablet Commonly known as:  NORVASC     TAKE these medications   metoprolol tartrate 25 MG tablet Commonly known as:  LOPRESSOR Take 1 tablet (25 mg total) by mouth 2 (two) times daily.         DISCHARGE INSTRUCTIONS:   DIET:  Cardiac diet  DISCHARGE CONDITION:  Stable  ACTIVITY:  Activity as tolerated  OXYGEN:  Home Oxygen: No.   Oxygen Delivery: room  air  DISCHARGE LOCATION:  home   If you experience worsening of your admission symptoms, develop shortness of breath, life threatening emergency, suicidal or homicidal thoughts you must seek medical attention immediately by calling 911 or calling your MD immediately  if symptoms less severe.  You Must read complete instructions/literature along with all the possible adverse reactions/side effects for all the Medicines you take and that have been prescribed to you. Take any new Medicines after you have completely understood and accpet all the possible adverse reactions/side effects.   Please note  You were cared for by a hospitalist during your hospital stay. If you have any questions about your discharge medications or the care you received while you were in the hospital after you are discharged, you can call the unit and asked to speak with the hospitalist on call if the hospitalist that took care of you is not available. Once you are discharged, your primary care physician will handle any further medical issues. Please note that NO REFILLS for any discharge medications will be authorized once you are discharged, as it is imperative that you return to your primary care physician (or establish a relationship with a primary care physician if you do not have one) for your aftercare needs so that they can reassess your need for medications and monitor your lab values.     Today   Currently CP free and hemodynamically stable.    VITAL SIGNS:  Blood pressure (!) 153/98, pulse (!) 50, temperature 98.5 F (36.9 C), temperature  source Oral, resp. rate 18, height 5\' 8"  (1.727 m), weight 103 kg (227 lb), SpO2 99 %.  I/O:  No intake or output data in the 24 hours ending 05/11/16 1549  PHYSICAL EXAMINATION:  GENERAL:  40 y.o.-year-old patient lying in the bed with no acute distress.  EYES: Pupils equal, round, reactive to light and accommodation. No scleral icterus. Extraocular muscles intact.   HEENT: Head atraumatic, normocephalic. Oropharynx and nasopharynx clear.  NECK:  Supple, no jugular venous distention. No thyroid enlargement, no tenderness.  LUNGS: Normal breath sounds bilaterally, no wheezing, rales,rhonchi. No use of accessory muscles of respiration.  CARDIOVASCULAR: S1, S2 normal. No murmurs, rubs, or gallops.  ABDOMEN: Soft, non-tender, non-distended. Bowel sounds present. No organomegaly or mass.  EXTREMITIES: No pedal edema, cyanosis, or clubbing.  NEUROLOGIC: Cranial nerves II through XII are intact. No focal motor or sensory defecits b/l.  PSYCHIATRIC: The patient is alert and oriented x 3. Good affect.  SKIN: No obvious rash, lesion, or ulcer.   Management plans discussed with the patient, family and they are in agreement.  CODE STATUS:  Code Status History    Date Active Date Inactive Code Status Order ID Comments User Context   04/26/2016  4:28 PM 04/27/2016  3:40 PM Full Code [id number]  Dalia HeadingKenneth A Fath, MD Inpatient   04/26/2016 12:55 PM 04/26/2016  4:28 PM Full Code [id number]  Katharina Caperima Vaickute, MD Inpatient      TOTAL TIME TAKING CARE OF THIS PATIENT: 40 minutes.    Houston SirenSAINANI,VIVEK J M.D on 05/11/2016 at 3:49 PM  Between 7am to 6pm - Pager - [phone]  After 6pm go to [email or web address] - Scientist, research (life [email or web address])password [email or web address]  [facility] [place] [facility]  Office  [phone]  CC: Primary care physician; No PCP Per Patient

## 2016-09-16 ENCOUNTER — Encounter: Payer: Self-pay | Admitting: Emergency Medicine

## 2016-09-16 ENCOUNTER — Emergency Department
Admission: EM | Admit: 2016-09-16 | Discharge: 2016-09-16 | Disposition: A | Discharge status: Home / Self Care | Payer: No Typology Code available for payment source | Attending: Emergency Medicine | Admitting: Emergency Medicine

## 2016-09-16 DIAGNOSIS — R110 Nausea: Secondary | ICD-10-CM | POA: Diagnosis not present

## 2016-09-16 DIAGNOSIS — F17290 Nicotine dependence, other tobacco product, uncomplicated: Secondary | ICD-10-CM | POA: Insufficient documentation

## 2016-09-16 DIAGNOSIS — R197 Diarrhea, unspecified: Secondary | ICD-10-CM | POA: Insufficient documentation

## 2016-09-16 DIAGNOSIS — I10 Essential (primary) hypertension: Secondary | ICD-10-CM | POA: Diagnosis not present

## 2016-09-16 DIAGNOSIS — F17210 Nicotine dependence, cigarettes, uncomplicated: Secondary | ICD-10-CM | POA: Diagnosis not present

## 2016-09-16 DIAGNOSIS — R1030 Lower abdominal pain, unspecified: Secondary | ICD-10-CM | POA: Diagnosis not present

## 2016-09-16 LAB — COMPREHENSIVE METABOLIC PANEL
ALBUMIN: 4.2 g/dL (ref 3.5–5.0)
ALK PHOS: 67 U/L (ref 38–126)
ALT: 20 U/L (ref 17–63)
ANION GAP: 5 (ref 5–15)
AST: 25 U/L (ref 15–41)
BUN: 13 mg/dL (ref 6–20)
CALCIUM: 8.8 mg/dL — AB (ref 8.9–10.3)
CO2: 24 mmol/L (ref 22–32)
Chloride: 113 mmol/L — ABNORMAL HIGH (ref 101–111)
Creatinine, Ser: 1.07 mg/dL (ref 0.61–1.24)
GFR calc Af Amer: >60 mL/min (ref >60)
GFR calc non Af Amer: >60 mL/min (ref >60)
GLUCOSE: 104 mg/dL — AB (ref 65–99)
Potassium: 3.7 mmol/L (ref 3.5–5.1)
SODIUM: 142 mmol/L (ref 135–145)
Total Bilirubin: 0.3 mg/dL (ref 0.3–1.2)
Total Protein: 7.4 g/dL (ref 6.5–8.1)

## 2016-09-16 LAB — CBC
HCT: 42.8 % (ref 40.0–52.0)
HEMOGLOBIN: 15.1 g/dL (ref 13.0–18.0)
MCH: 31.4 pg (ref 26.0–34.0)
MCHC: 35.3 g/dL (ref 32.0–36.0)
MCV: 89.1 fL (ref 80.0–100.0)
Platelets: 171 10*3/uL (ref 150–440)
RBC: 4.81 MIL/uL (ref 4.40–5.90)
RDW: 14.2 % (ref 11.5–14.5)
WBC: 10.9 10*3/uL — AB (ref 3.8–10.6)

## 2016-09-16 LAB — TROPONIN I: Troponin I: <0.03 ng/mL (ref <0.03)

## 2016-09-16 LAB — LIPASE, BLOOD: Lipase: 33 U/L (ref 11–51)

## 2016-09-16 MED ORDER — ONDANSETRON 4 MG PO TBDP
4.0000 mg | ORAL_TABLET | Freq: Once | ORAL | Status: AC
  Administered 2016-09-16: 4 mg via ORAL
  Filled 2016-09-16: qty 1

## 2016-09-16 MED ORDER — METOPROLOL TARTRATE 25 MG PO TABS: 25.0000 mg | ORAL_TABLET | Freq: Two times a day (BID) | ORAL | 1 refills | Status: DC

## 2016-09-16 MED ORDER — ONDANSETRON 4 MG PO TBDP: ORAL_TABLET | ORAL | 0 refills | Status: AC

## 2016-09-16 NOTE — Discharge Instructions (Signed)
You have been seen in the Emergency Department (ED) for abdominal pain.  Your evaluation did not identify a clear cause of your symptoms but was generally reassuring.  We believe your discomfort is most likely caused by your alcohol use last night.  Stick to a bland diet today to give your stomach and bowels a chance to rest, and avoid additional alcohol.  Please follow up as instructed above regarding today?s emergent visit and the symptoms that are bothering you.  Return to the ED if your abdominal pain worsens or fails to improve, you develop bloody vomiting, bloody diarrhea, you are unable to tolerate fluids due to vomiting, fever greater than 101, or other symptoms that concern you.

## 2016-09-16 NOTE — ED Provider Notes (Addendum)
[facility] Emergency Department Provider Note  ____________________________________________   First MD Initiated Contact with Patient 09/16/16 1207     (approximate)  I have reviewed the triage vital signs and the nursing notes.   HISTORY  Chief Complaint Abdominal Pain    HPI Jonathan Caldwell is a 41 y.o. male who presents for evaluation of acute onset lower abdominal pain and cramping with 3 episodes of loose stools this morning.  He reports that he went out drinking last night and drank "a lot" and cannot remember how much exactly he ingested.  He reports he drank a combination of liquor and beer.  When he woke up this morning around 8:00 U he was having lower abdominal pain and then has had several episodes of diarrhea since then.  He has seen no blood in it but the stool seemed dark.  He feels a little bit better now and has some nausea but has not vomited.  He reports no upper abdominal pain including no epigastric pain.  Nothing in particular makes his symptoms better nor worse.  He describes the symptoms as severe initially but now they are mild.  He denies fever/chills, chest pain, shortness of breath.  He denies any illegal drug use.  Medical history is notable for some EKG abnormalities and chest pain about 4 months ago for which she had an admission to the hospital and underwent a cardiac catheterization that was clean and clear.  He is on metoprolol for blood pressure although he reports that he ran out and has not been taking it since the prescription expired.   Past Medical History:  Diagnosis Date  . Chronic knee pain   . Hypertension     Patient Active Problem List   Diagnosis Date Noted  . Chest pain 04/26/2016  . Palpitations 04/26/2016  . Tobacco abuse counseling 04/26/2016  . Alcohol abuse 04/26/2016  . Marijuana abuse 04/26/2016  . 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 ALCOHOL ABUSE, HX OF 05/09/2009  . TOBACCO ABUSE, HX OF 05/09/2009  .  CHEST PAIN, ATYPICAL, HX OF 05/09/2009    Past Surgical History:  Procedure Laterality Date  . CARDIAC CATHETERIZATION N/A 04/26/2016   Procedure: Left Heart Cath and Coronary Angiography;  Surgeon: Dalia Heading, MD;  Location: [facility] INVASIVE CV LAB;  Service: Cardiovascular;  Laterality: N/A;    Prior to Admission medications   Medication Sig Start Date End Date Taking? Authorizing Provider  metoprolol tartrate (LOPRESSOR) 25 MG tablet Take 1 tablet (25 mg total) by mouth 2 (two) times daily. 09/16/16   Loleta Rose, MD  ondansetron (ZOFRAN ODT) 4 MG disintegrating tablet Allow 1-2 tablets to dissolve in your mouth every 8 hours as needed for nausea/vomiting 09/16/16   Loleta Rose, MD    Allergies Patient has no known allergies.  No family history on file.  Social History Social History  Substance Use Topics  . Smoking status: Current Every Day Smoker    Packs/day: 0.50    Years: 5.00    Types: Cigarettes, Cigars  . Smokeless tobacco: Never Used  . Alcohol use Yes     Comment: occ    Review of Systems Constitutional: No fever/chills Eyes: No visual changes. ENT: No sore throat. Cardiovascular: Denies chest pain. Respiratory: Denies shortness of breath. Gastrointestinal: Lower abdominal pain.  Nausea, no vomiting.  +diarrhea.  No constipation. Genitourinary: Negative for dysuria. Musculoskeletal: Negative for back pain. Skin: Negative for rash. Neurological: Negative for headaches, focal weakness or numbness.  10-point  ROS otherwise negative.  ____________________________________________   PHYSICAL EXAM:  VITAL SIGNS: ED Triage Vitals  Enc Vitals Group     BP 09/16/16 1106 (!) 159/104     Pulse Rate 09/16/16 1106 84     Resp 09/16/16 1106 20     Temp 09/16/16 1106 98.5 F (36.9 C)     Temp Source 09/16/16 1106 Oral     SpO2 09/16/16 1106 96 %     Weight 09/16/16 1107 230 lb (104.3 kg)     Height 09/16/16 1107 6' (1.829 m)     Head Circumference --       Peak Flow --      Pain Score 09/16/16 1108 6     Pain Loc --      Pain Edu? --      Excl. in GC? --     Constitutional: Alert and oriented. Well appearing and in no acute distress. Eyes: Conjunctivae are normal. PERRL. EOMI. Head: Atraumatic. Nose: No congestion/rhinnorhea. Mouth/Throat: Mucous membranes are moist. Neck: No stridor.  No meningeal signs.   Cardiovascular: Normal rate, regular rhythm. Good peripheral circulation. Grossly normal heart sounds. Respiratory: Normal respiratory effort.  No retractions. Lungs CTAB. Gastrointestinal: Soft and nontender. No distention.  Musculoskeletal: No lower extremity tenderness nor edema. No gross deformities of extremities. Neurologic:  Normal speech and language. No gross focal neurologic deficits are appreciated.  Skin:  Skin is warm, dry and intact. No rash noted. Psychiatric: Mood and affect are normal. Speech and behavior are normal.  ____________________________________________   LABS (all labs ordered are listed, but only abnormal results are displayed)  Labs Reviewed  CBC - Abnormal; Notable for the following:       Result Value   WBC 10.9 (*)    All other components within normal limits  COMPREHENSIVE METABOLIC PANEL - Abnormal; Notable for the following:    Chloride 113 (*)    Glucose, Bld 104 (*)    Calcium 8.8 (*)    All other components within normal limits  TROPONIN I  LIPASE, BLOOD   ____________________________________________  EKG  ED ECG REPORT I, Adalene Gulotta, the attending physician, personally viewed and interpreted this ECG.  Date: 09/16/2016 EKG Time: 11:10 AM Rate: 81 Rhythm: normal sinus rhythm QRS Axis: normal Intervals: normal ST/T Wave abnormalities: Inverted T waves in leads 3, aVF, V5, and V6. Conduction Disturbances: none Narrative Interpretation: Compared EKG to EKGs in October (4 months ago) and there is no significant change    ____________________________________________  RADIOLOGY   No results found.  ____________________________________________   PROCEDURES  Procedure(s) performed:   Procedures   Critical Care performed: No ____________________________________________   INITIAL IMPRESSION / ASSESSMENT AND PLAN / ED COURSE  Pertinent labs & imaging results that were available during my care of the patient were reviewed by me and considered in my medical decision making (see chart for details).  The patient has no EKGs compared to prior.  He has no tenderness to palpation of the abdomen.  Labs are pending but I anticipate they will be reassuring.  I discussed with him the effects that alcohol can have on his GI tract and we are giving him a Zofran to see if that helps settle his stomach.  There is no evidence of acute or emergent medical condition at this time and the patient states that he "just wanted to get checked out.".  He and his wife also mentioned that he is out of his metoprolol which I will refill  until he can follow up with Dr. Lady Gary.   Clinical Course as of Sep 16 2001  Mon Sep 16, 2016  1338 Patient feels much better, states he is hungry and wants to go home.  I gave my usual and customary return precautions.   [CF]    Clinical Course User Index [CF] Loleta Rose, MD    ____________________________________________  FINAL CLINICAL IMPRESSION(S) / ED DIAGNOSES  Final diagnoses:  Lower abdominal pain  Diarrhea, unspecified type     MEDICATIONS GIVEN DURING THIS VISIT:  Medications  ondansetron (ZOFRAN-ODT) disintegrating tablet 4 mg (4 mg Oral Given 09/16/16 1224)     NEW OUTPATIENT MEDICATIONS STARTED DURING THIS VISIT:  Discharge Medication List as of 09/16/2016  1:53 PM    START taking these medications   Details  ondansetron (ZOFRAN ODT) 4 MG disintegrating tablet Allow 1-2 tablets to dissolve in your mouth every 8 hours as needed for nausea/vomiting, Print         Discharge Medication List as of 09/16/2016  1:53 PM    CONTINUE these medications which have CHANGED   Details  metoprolol tartrate (LOPRESSOR) 25 MG tablet Take 1 tablet (25 mg total) by mouth 2 (two) times daily., Starting Mon 09/16/2016, Print        Discharge Medication List as of 09/16/2016  1:53 PM       Note:  This document was prepared using Dragon voice recognition software and may include unintentional dictation errors.    Loleta Rose, MD 09/16/16 1339    Loleta Rose, MD 09/16/16 2003

## 2016-09-16 NOTE — ED Triage Notes (Signed)
Epigastric pain, denies SOB.

## 2016-10-03 ENCOUNTER — Emergency Department
Admission: EM | Admit: 2016-10-03 | Discharge: 2016-10-03 | Disposition: A | Discharge status: Home / Self Care | Payer: No Typology Code available for payment source | Attending: Emergency Medicine | Admitting: Emergency Medicine

## 2016-10-03 DIAGNOSIS — Y929 Unspecified place or not applicable: Secondary | ICD-10-CM | POA: Insufficient documentation

## 2016-10-03 DIAGNOSIS — Z9119 Patient's noncompliance with other medical treatment and regimen: Secondary | ICD-10-CM | POA: Diagnosis not present

## 2016-10-03 DIAGNOSIS — I10 Essential (primary) hypertension: Secondary | ICD-10-CM | POA: Insufficient documentation

## 2016-10-03 DIAGNOSIS — F17210 Nicotine dependence, cigarettes, uncomplicated: Secondary | ICD-10-CM | POA: Insufficient documentation

## 2016-10-03 DIAGNOSIS — M79601 Pain in right arm: Secondary | ICD-10-CM | POA: Insufficient documentation

## 2016-10-03 DIAGNOSIS — Y990 Civilian activity done for income or pay: Secondary | ICD-10-CM | POA: Diagnosis not present

## 2016-10-03 DIAGNOSIS — X501XXA Overexertion from prolonged static or awkward postures, initial encounter: Secondary | ICD-10-CM | POA: Diagnosis not present

## 2016-10-03 DIAGNOSIS — S39012A Strain of muscle, fascia and tendon of lower back, initial encounter: Secondary | ICD-10-CM | POA: Insufficient documentation

## 2016-10-03 DIAGNOSIS — S3992XA Unspecified injury of lower back, initial encounter: Secondary | ICD-10-CM | POA: Diagnosis present

## 2016-10-03 DIAGNOSIS — Y93F2 Activity, caregiving, lifting: Secondary | ICD-10-CM | POA: Diagnosis not present

## 2016-10-03 DIAGNOSIS — M545 Low back pain, unspecified: Secondary | ICD-10-CM

## 2016-10-03 DIAGNOSIS — F17290 Nicotine dependence, other tobacco product, uncomplicated: Secondary | ICD-10-CM | POA: Diagnosis not present

## 2016-10-03 DIAGNOSIS — T148XXA Other injury of unspecified body region, initial encounter: Secondary | ICD-10-CM

## 2016-10-03 DIAGNOSIS — Z91199 Patient's noncompliance with other medical treatment and regimen due to unspecified reason: Secondary | ICD-10-CM

## 2016-10-03 MED ORDER — ETODOLAC 400 MG PO TABS: 400.0000 mg | ORAL_TABLET | Freq: Two times a day (BID) | ORAL | 0 refills | Status: AC

## 2016-10-03 NOTE — ED Notes (Signed)
See triage note  States he developed pain to right arm about 3-4 days ago.  Also has had lower back pain and some discomfort /stiffness in right leg around the same time.Marland Kitchen.deneis any injury  States he gets relief with OTC meds

## 2016-10-03 NOTE — ED Provider Notes (Signed)
[facility] Emergency Department Provider Note  ____________________________________________   First MD Initiated Contact with Patient 10/03/16 [phone]     (approximate)  I have reviewed the triage vital signs and the nursing notes.   HISTORY  Chief Complaint Back Pain    HPI Jonathan Caldwell is a 41 y.o. male here complaining of low back pain for the last 3-4 days. Patient states that he has been lifting and pushing a lot at work and has had muscle pain. Patient states that he takes one Aleve once a day because he has no pain at night or while he sleeping. He states pain which starts after he has been at work. He also has had some right arm pain while at work with lifting and pushing. He is also experienced some stiffness in his right leg and has taken Aleve for that occasionally as well. Patient states he does get relief from over-the-counter medication. Patient denies any fall or injury. He denies any previous problems with his back but is aware that his back was x-rayed approximate 7 years ago. He denies any urinary symptoms or history of kidney stones. Patient also has an elevated blood pressure 170/100. Patient states that he does not have a primary care doctor and generally comes to the emergency room for his medication refills. Patient took his medication yesterday but denies taking it today. Currently he rates his pain as 7/10.   Past Medical History:  Diagnosis Date  . Chronic knee pain   . Hypertension     Patient Active Problem List   Diagnosis Date Noted  . Chest pain 04/26/2016  . Palpitations 04/26/2016  . Tobacco abuse counseling 04/26/2016  . Alcohol abuse 04/26/2016  . Marijuana abuse 04/26/2016  . 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 ALCOHOL ABUSE, HX OF 05/09/2009  . TOBACCO ABUSE, HX OF 05/09/2009  . CHEST PAIN, ATYPICAL, HX OF 05/09/2009    Past Surgical History:  Procedure Laterality Date  . CARDIAC CATHETERIZATION N/A 04/26/2016   Procedure: Left Heart Cath and Coronary Angiography;  Surgeon: Dalia Heading, MD;  Location: [facility] INVASIVE CV LAB;  Service: Cardiovascular;  Laterality: N/A;    Prior to Admission medications   Medication Sig Start Date End Date Taking? Authorizing Provider  etodolac (LODINE) 400 MG tablet Take 1 tablet (400 mg total) by mouth 2 (two) times daily. 10/03/16   Tommi Rumps, PA-C  metoprolol tartrate (LOPRESSOR) 25 MG tablet Take 1 tablet (25 mg total) by mouth 2 (two) times daily. 09/16/16   Loleta Rose, MD  ondansetron (ZOFRAN ODT) 4 MG disintegrating tablet Allow 1-2 tablets to dissolve in your mouth every 8 hours as needed for nausea/vomiting 09/16/16   Loleta Rose, MD    Allergies Patient has no known allergies.  No family history on file.  Social History Social History  Substance Use Topics  . Smoking status: Current Every Day Smoker    Packs/day: 0.50    Years: 5.00    Types: Cigarettes, Cigars  . Smokeless tobacco: Never Used  . Alcohol use Yes     Comment: occ    Review of Systems Constitutional: No fever/chills  Eyes: No visual changes. ENT: No sore throat. Cardiovascular: Denies chest pain. History past palpitations Respiratory: Denies shortness of breath. Gastrointestinal: No abdominal pain.  No nausea, no vomiting.  Genitourinary: Negative for dysuria. No history kidney stone. Musculoskeletal: Positive low back pain. Positive right arm pain. Skin: Negative for rash. Neurological: Negative for headaches, focal weakness or numbness.  Psychiatric:Alcohol, tobacco, marijuana abuse.    10-point ROS otherwise negative.  ____________________________________________   PHYSICAL EXAM:  VITAL SIGNS: ED Triage Vitals  Enc Vitals Group     BP 10/03/16 0919 (!) 170/100     Pulse Rate 10/03/16 0919 86     Resp 10/03/16 0919 17     Temp 10/03/16 0919 98 F (36.7 C)     Temp Source 10/03/16 0919 Oral     SpO2 10/03/16 0919 96 %     Weight 10/03/16 0917 235 lb  (106.6 kg)     Height 10/03/16 0917 6' (1.829 m)     Head Circumference --      Peak Flow --      Pain Score 10/03/16 0918 7     Pain Loc --      Pain Edu? --      Excl. in GC? --     Constitutional: Alert and oriented. Well appearing and in no acute distress. Eyes: Conjunctivae are normal. PERRL. EOMI. Head: Atraumatic. Nose: No congestion/rhinnorhea. Neck: No stridor.   Cardiovascular: Normal rate, regular rhythm. Grossly normal heart sounds.  Good peripheral circulation. Respiratory: Normal respiratory effort.  No retractions. Lungs CTAB. Gastrointestinal: Soft and nontender. No distention. Musculoskeletal: Moves upper and lower extremities without any difficulty. On examination of the back there is no gross deformity noted. There is tenderness on palpation of L5-S1 area to palpation and paravertebral muscles bilaterally. Range of motion without active muscle spasm seen however patient is slow to get from a sitting to standing position. Normal gait was noted. Straight leg raises on the left approximately 20 with discomfort. On examination of his right arm range of motion is without restriction. No crepitus is noted. No edema or point tenderness appreciated. Good strength noted. No ecchymosis, erythema or abrasions noted. Neurologic:  Normal speech and language. No gross focal neurologic deficits are appreciated. No gait instability. Reflexes are 2+ bilaterally. Skin:  Skin is warm, dry and intact. No rash noted. Psychiatric: Mood and affect are normal. Speech and behavior are normal.  ____________________________________________   LABS (all labs ordered are listed, but only abnormal results are displayed)  Labs Reviewed - No data to display ____________________________________________   PROCEDURES  Procedure(s) performed: None  Procedures  Critical Care performed: No  ____________________________________________   INITIAL IMPRESSION / ASSESSMENT AND PLAN / ED  COURSE  Pertinent labs & imaging results that were available during my care of the patient were reviewed by me and considered in my medical decision making (see chart for details).   She states that his wife made an appointment with a medical clinic for follow-up of his blood pressure for next week. He is unaware of the name of the doctor that he will be seeing. He is encouraged to keep that appointment and to begin taking his blood pressure medication on a daily basis. He is also given a prescription for etodolac 400 mg twice a day with food. He may also use ice or heat to his back as needed. The patient requested a note to remain out of work today.     ____________________________________________   FINAL CLINICAL IMPRESSION(S) / ED DIAGNOSES  Final diagnoses:  Acute bilateral low back pain without sciatica  Muscle strain  Non-compliant patient      NEW MEDICATIONS STARTED DURING THIS VISIT:  Discharge Medication List as of 10/03/2016 10:33 AM    START taking these medications   Details  etodolac (LODINE) 400 MG tablet Take 1 tablet (400  mg total) by mouth 2 (two) times daily., Starting Thu 10/03/2016, Print         Note:  This document was prepared using Dragon voice recognition software and may include unintentional dictation errors.    Tommi RumpsRhonda L Kadijah Shamoon, PA-C 10/03/16 1055    Governor Rooksebecca Lord, MD 10/03/16 [phone]

## 2016-10-03 NOTE — Discharge Instructions (Signed)
Begin taking etodolac twice a day with food for back pain. Keep your appointmetn next week for the doctor that you have an appointment with. Take all medicines with you to this appointment. Take blood pressure medication daily to control your  blood pressure. Today's blood pressure is 146/108 on the second at a time.

## 2016-10-03 NOTE — ED Triage Notes (Signed)
Pt c/o lower back pain and right arm pain for the past 3-4 days. Denies injury..Marland Kitchen

## 2016-11-30 IMAGING — CT CT ABD-PELV W/ CM
2 of 4 series · 16 of 46 positions shown, 18 images · IV contrast (omnipaque)
Comparison: 10/20/2014 abdominal radiograph

CLINICAL DATA: Acute right lower abdominal pelvic pain with nausea
for 1 month.

EXAM:
CT ABDOMEN AND PELVIS WITH CONTRAST
TECHNIQUE: Multidetector CT imaging of the abdomen and pelvis was performed
using the standard protocol following bolus administration of
intravenous contrast.
CONTRAST:  100mL OMNIPAQUE IOHEXOL 300 MG/ML  SOLN

[Series 2: routine abd pel with · axial · 0.71mm/px · z∈[-482,-38]mm · 13 of 99 slices shown, 15 images]
[im 5/99  soft-tissue]
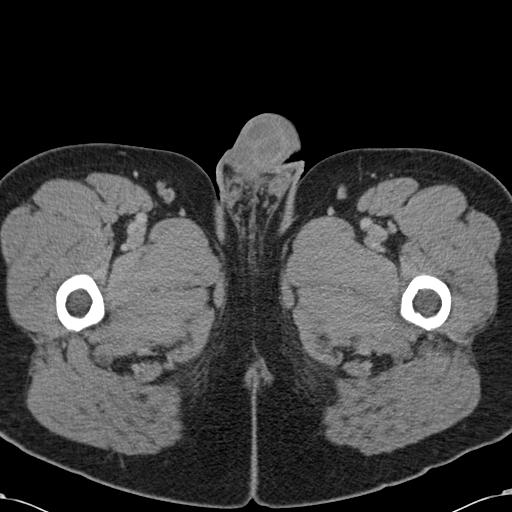
[im 5/99  bone]
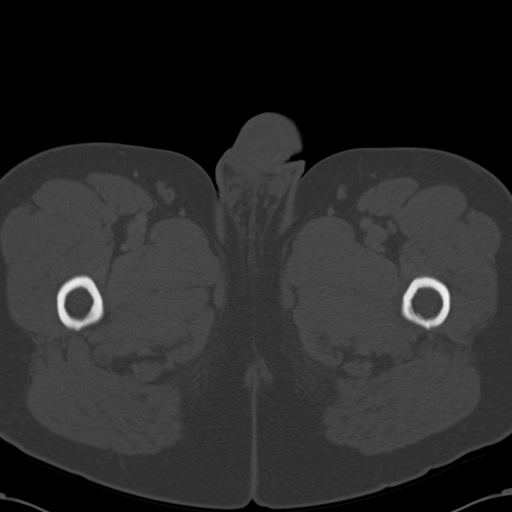
[im 13/99  soft-tissue]
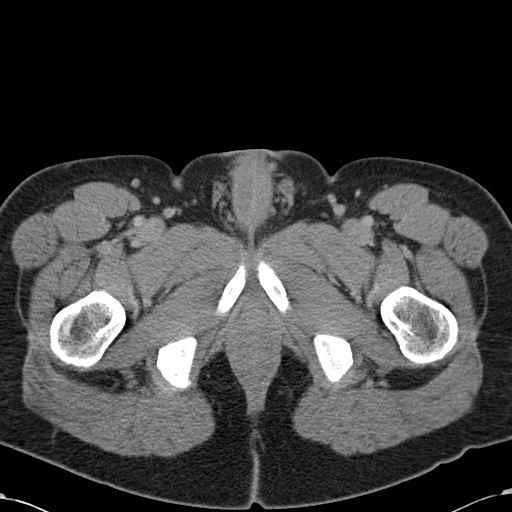
[im 21/99  soft-tissue]
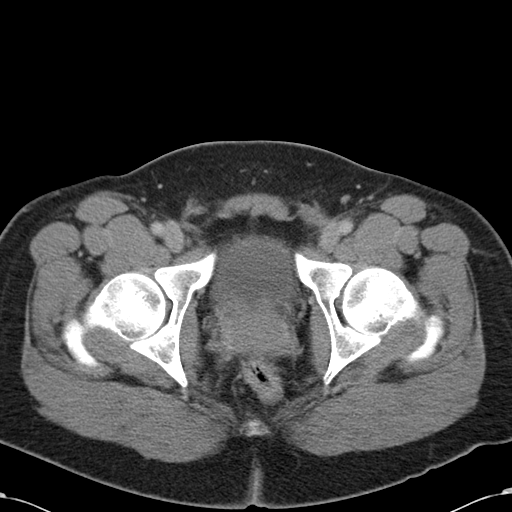
[im 29/99  soft-tissue]
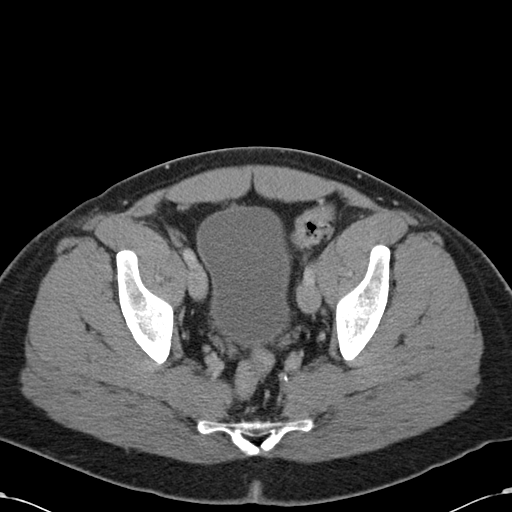
[im 33/99  soft-tissue]
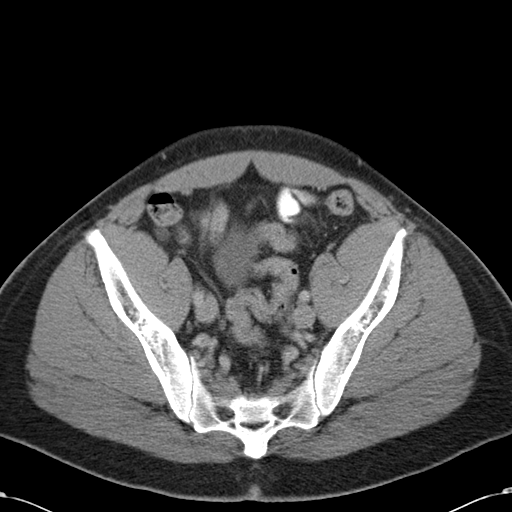
[im 41/99  soft-tissue]
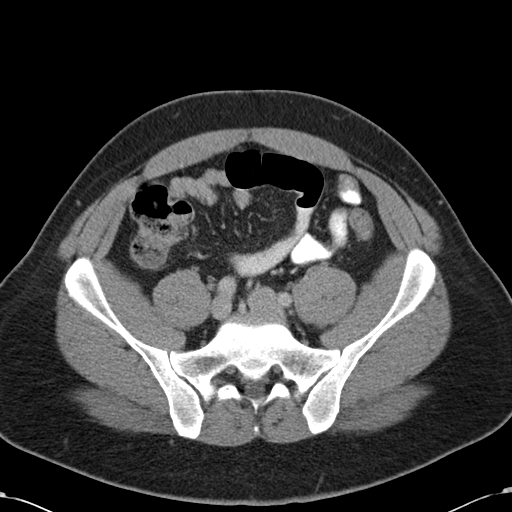
[im 50/99  soft-tissue]
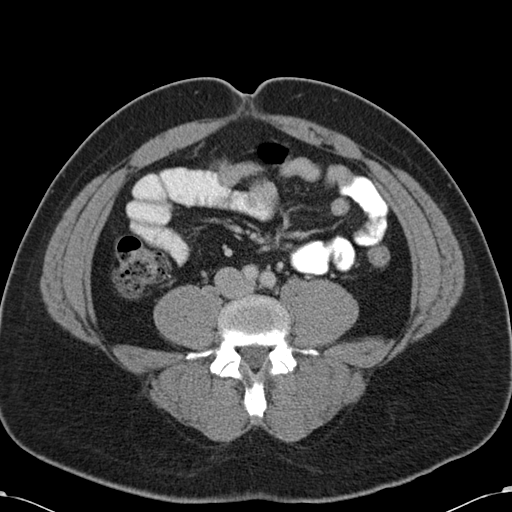
[im 58/99  soft-tissue]
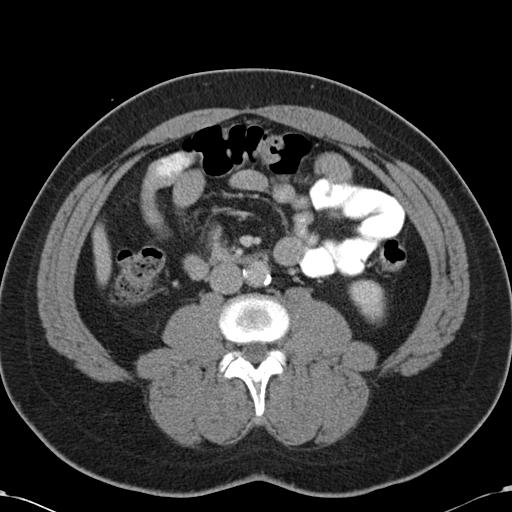
[im 66/99  soft-tissue]
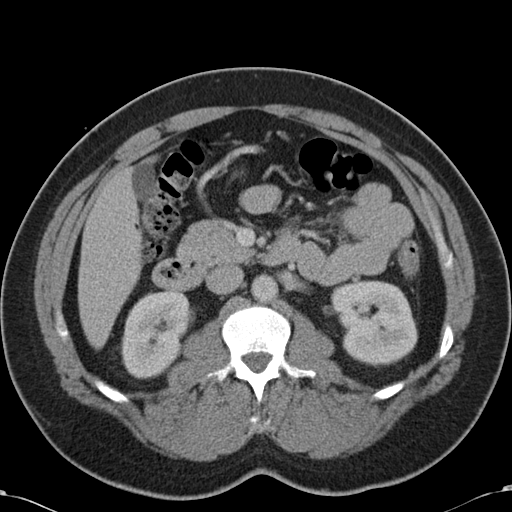
[im 66/99  bone]
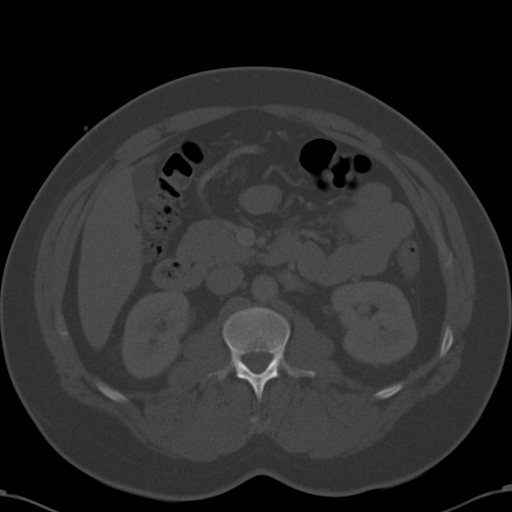
[im 70/99  soft-tissue]
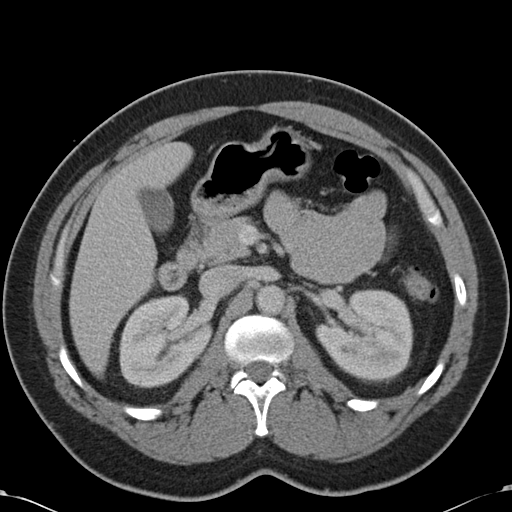
[im 78/99  soft-tissue]
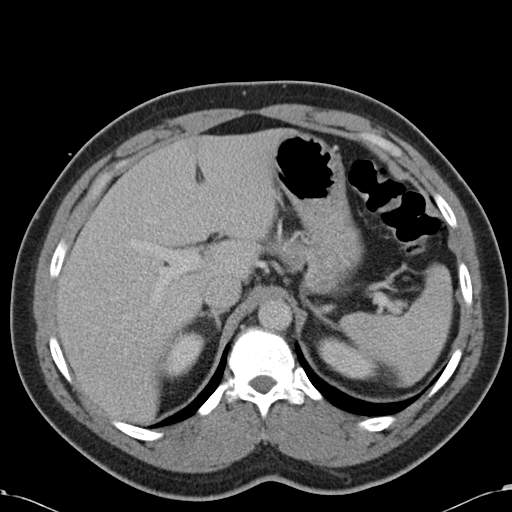
[im 86/99  soft-tissue]
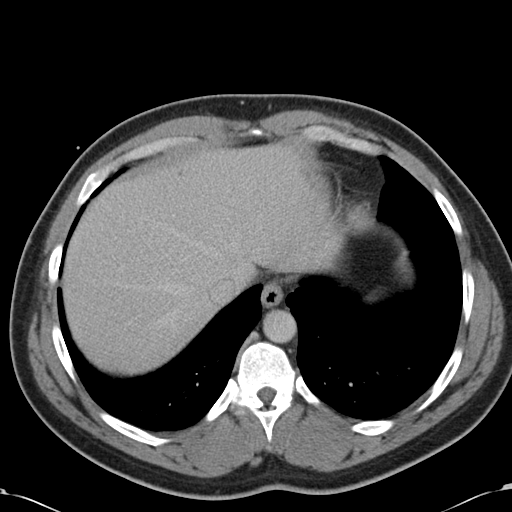
[im 94/99  soft-tissue]
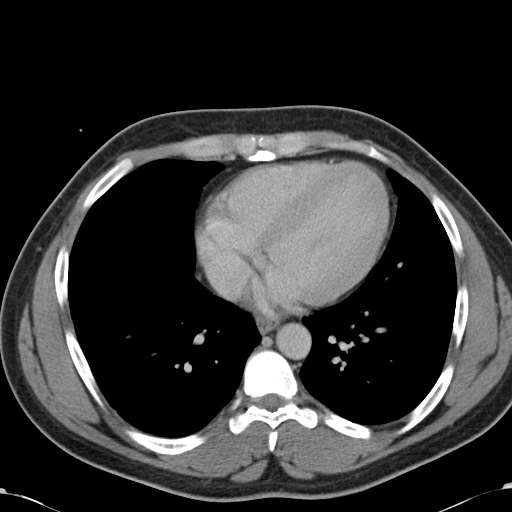

[Series 5: cor routine abd pel with · coronal · 0.72mm/px · 3 of 138 slices shown]
[im 46/138  soft-tissue]
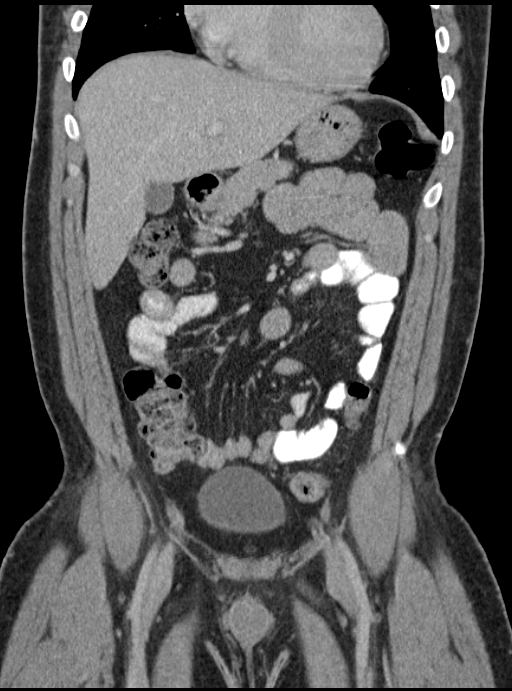
[im 61/138  soft-tissue]
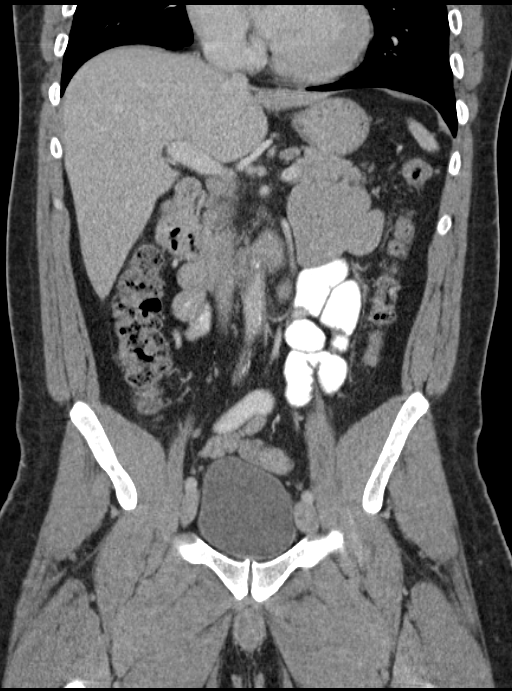
[im 77/138  soft-tissue]
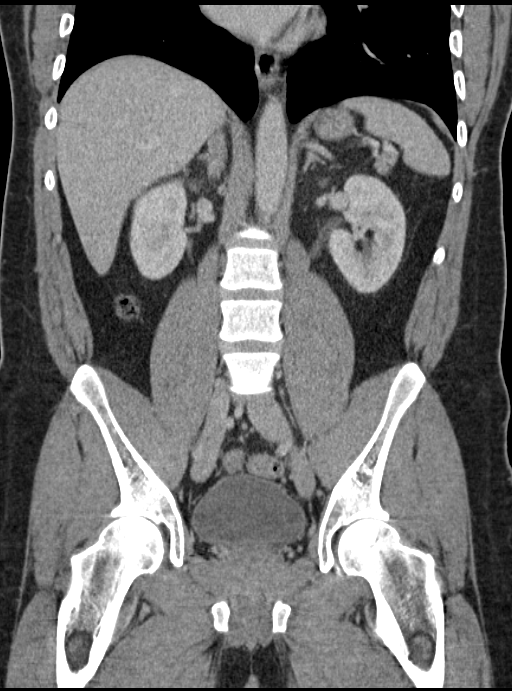

[16 of 46 positions shown; findings below may reference images not displayed]

FINDINGS: Lower chest: Minor dependent basilar atelectasis. No lower lobe
pneumonia, collapse or consolidation. Normal heart size. No
pericardial or pleural effusion.

Hepatobiliary: Incidental focal fatty infiltration of the liver
along the falciform ligament, image 23. No other hepatic abnormality
or biliary dilatation. Patent hepatic and portal veins. Gallbladder
and biliary system unremarkable.

Pancreas: No mass, inflammatory changes, or other significant
abnormality.

Spleen: Within normal limits in size and appearance.

Adrenals/Urinary Tract: No masses identified. No evidence of
hydronephrosis.

Stomach/Bowel: Negative for bowel obstruction, dilatation, ileus, or
free air. Normal appendix demonstrated. No acute inflammatory
process, abscess or fluid collection.

Vascular/Lymphatic: No adenopathy. Abdominal atherosclerosis evident
of the distal aorta and bifurcation. No aneurysm or occlusive
process.

Reproductive: No mass or other significant abnormality.

Other: No inguinal abnormality or hernia.

Musculoskeletal:  No acute or abnormal osseous finding.
IMPRESSION: No acute intra-abdominal or pelvic finding.

Normal appendix

Minor basilar atelectasis

Focal fatty infiltration of the liver

Aortoiliac atherosclerosis

## 2016-12-09 ENCOUNTER — Emergency Department
Admission: EM | Admit: 2016-12-09 | Discharge: 2016-12-09 | Disposition: A | Discharge status: Home / Self Care | Payer: No Typology Code available for payment source | Attending: Emergency Medicine | Admitting: Emergency Medicine

## 2016-12-09 ENCOUNTER — Encounter: Payer: Self-pay | Admitting: Emergency Medicine

## 2016-12-09 DIAGNOSIS — Y929 Unspecified place or not applicable: Secondary | ICD-10-CM | POA: Insufficient documentation

## 2016-12-09 DIAGNOSIS — W268XXA Contact with other sharp object(s), not elsewhere classified, initial encounter: Secondary | ICD-10-CM | POA: Diagnosis not present

## 2016-12-09 DIAGNOSIS — Y999 Unspecified external cause status: Secondary | ICD-10-CM | POA: Diagnosis not present

## 2016-12-09 DIAGNOSIS — Y939 Activity, unspecified: Secondary | ICD-10-CM | POA: Diagnosis not present

## 2016-12-09 DIAGNOSIS — Z79899 Other long term (current) drug therapy: Secondary | ICD-10-CM | POA: Insufficient documentation

## 2016-12-09 DIAGNOSIS — I10 Essential (primary) hypertension: Secondary | ICD-10-CM | POA: Diagnosis not present

## 2016-12-09 DIAGNOSIS — F17210 Nicotine dependence, cigarettes, uncomplicated: Secondary | ICD-10-CM | POA: Diagnosis not present

## 2016-12-09 DIAGNOSIS — S6992XA Unspecified injury of left wrist, hand and finger(s), initial encounter: Secondary | ICD-10-CM | POA: Diagnosis present

## 2016-12-09 DIAGNOSIS — S61213A Laceration without foreign body of left middle finger without damage to nail, initial encounter: Secondary | ICD-10-CM

## 2016-12-09 MED ORDER — CLINDAMYCIN PHOSPHATE 300 MG/2ML IJ SOLN
INTRAMUSCULAR | Status: AC
  Filled 2016-12-09: qty 2

## 2016-12-09 MED ORDER — TETANUS-DIPHTH-ACELL PERTUSSIS 5-2.5-18.5 LF-MCG/0.5 IM SUSP
0.5000 mL | Freq: Once | INTRAMUSCULAR | Status: AC
  Administered 2016-12-09: 0.5 mL via INTRAMUSCULAR
  Filled 2016-12-09: qty 0.5

## 2016-12-09 MED ORDER — CLINDAMYCIN HCL 300 MG PO CAPS: 300.0000 mg | ORAL_CAPSULE | Freq: Four times a day (QID) | ORAL | 0 refills | Status: AC

## 2016-12-09 MED ORDER — CLINDAMYCIN PHOSPHATE 900 MG/6ML IJ SOLN
600.0000 mg | Freq: Once | INTRAMUSCULAR | Status: AC
  Administered 2016-12-09: 600 mg via INTRAMUSCULAR

## 2016-12-09 NOTE — ED Triage Notes (Signed)
Pt with laceration to left third finger on Friday with box cutter.

## 2016-12-09 NOTE — ED Notes (Addendum)
See triage note   States he cut his left middle finger with a box cutter on Friday  States he cleaned up the area and placed a dressing  On arrival superficial laceration noed  No bleeding

## 2016-12-09 NOTE — ED Provider Notes (Signed)
[facility] Emergency Department Provider Note  ____________________________________________  Time seen: Approximately 12:41 PM  I have reviewed the triage vital signs and the nursing notes.   HISTORY  Chief Complaint Laceration    HPI Jonathan Caldwell is a 41 y.o. male that presents to emergency department with left finger laceration since Friday. Patient states that he cut his finger with a box cutter on Friday. After injuring his finger he placed a bandage on it and has not taken the bandage off until today. He noticed some yellow drainage from cut today. He does not remember last tetanus shot. He denies fever, shortness breath, chest pain, nausea, vomiting, abdominal pain.   Past Medical History:  Diagnosis Date  . Chronic knee pain   . Hypertension     Patient Active Problem List   Diagnosis Date Noted  . Chest pain 04/26/2016  . Palpitations 04/26/2016  . Tobacco abuse counseling 04/26/2016  . Alcohol abuse 04/26/2016  . Marijuana abuse 04/26/2016  . 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 ALCOHOL ABUSE, HX OF 05/09/2009  . TOBACCO ABUSE, HX OF 05/09/2009  . CHEST PAIN, ATYPICAL, HX OF 05/09/2009    Past Surgical History:  Procedure Laterality Date  . CARDIAC CATHETERIZATION N/A 04/26/2016   Procedure: Left Heart Cath and Coronary Angiography;  Surgeon: Dalia Heading, MD;  Location: [facility] INVASIVE CV LAB;  Service: Cardiovascular;  Laterality: N/A;    Prior to Admission medications   Medication Sig Start Date End Date Taking? Authorizing Provider  clindamycin (CLEOCIN) 300 MG capsule Take 1 capsule (300 mg total) by mouth 4 (four) times daily. 12/09/16 12/19/16  Enid Derry, PA-C  etodolac (LODINE) 400 MG tablet Take 1 tablet (400 mg total) by mouth 2 (two) times daily. 10/03/16   Tommi Rumps, PA-C  metoprolol tartrate (LOPRESSOR) 25 MG tablet Take 1 tablet (25 mg total) by mouth 2 (two) times daily. 09/16/16   Loleta Rose, MD  ondansetron  (ZOFRAN ODT) 4 MG disintegrating tablet Allow 1-2 tablets to dissolve in your mouth every 8 hours as needed for nausea/vomiting 09/16/16   Loleta Rose, MD    Allergies Patient has no known allergies.  No family history on file.  Social History Social History  Substance Use Topics  . Smoking status: Current Every Day Smoker    Packs/day: 0.50    Years: 5.00    Types: Cigarettes, Cigars  . Smokeless tobacco: Never Used  . Alcohol use Yes     Comment: occ     Review of Systems  Constitutional: No fever/chills Cardiovascular: No chest pain. Respiratory: No SOB. Gastrointestinal: No abdominal pain.  No nausea, no vomiting.  Musculoskeletal: Positive for finger pain. Skin: Negative for rash, ecchymosis. Positive for laceration. Neurological: Negative for headaches, numbness or tingling   ____________________________________________   PHYSICAL EXAM:  VITAL SIGNS: ED Triage Vitals  Enc Vitals Group     BP 12/09/16 1044 (!) 157/99     Pulse Rate 12/09/16 1044 68     Resp 12/09/16 1044 20     Temp --      Temp Source 12/09/16 1044 Oral     SpO2 12/09/16 1044 98 %     Weight 12/09/16 1045 230 lb (104.3 kg)     Height --      Head Circumference --      Peak Flow --      Pain Score 12/09/16 1044 5     Pain Loc --      Pain Edu? --  Excl. in GC? --      Constitutional: Alert and oriented. Well appearing and in no acute distress. Eyes: Conjunctivae are normal. PERRL. EOMI. Head: Atraumatic. ENT:      Ears:      Nose: No congestion/rhinnorhea.      Mouth/Throat: Mucous membranes are moist.  Neck: No stridor.  Cardiovascular: Normal rate, regular rhythm.  Good peripheral circulation. 2+ radial pulses. Respiratory: Normal respiratory effort without tachypnea or retractions. Lungs CTAB. Good air entry to the bases with no decreased or absent breath sounds. Musculoskeletal: Full range of motion to all extremities. No gross deformities appreciated. Neurologic:   Normal speech and language. No gross focal neurologic deficits are appreciated.  Skin:  Skin is warm, dry. 1/2 centimeter shallow well approximated laceration of distal left middle finger with yellow drainage. No surrounding erythema.    ____________________________________________   LABS (all labs ordered are listed, but only abnormal results are displayed)  Labs Reviewed - No data to display ____________________________________________  EKG   ____________________________________________  RADIOLOGY  No results found.  ____________________________________________    PROCEDURES  Procedure(s) performed:    Procedures    Medications  Tdap (BOOSTRIX) injection 0.5 mL (0.5 mLs Intramuscular Given 12/09/16 1249)  clindamycin (CLEOCIN) injection 600 mg (600 mg Intramuscular Given 12/09/16 1249)     ____________________________________________   INITIAL IMPRESSION / ASSESSMENT AND PLAN / ED COURSE  Pertinent labs & imaging results that were available during my care of the patient were reviewed by me and considered in my medical decision making (see chart for details).  Review of the [place] CSRS was performed in accordance of the [facility] prior to dispensing any controlled drugs.   Patient's diagnosis is consistent with infected finger laceration. Vital signs and exam are reassuring. IM clindamycin was given in ED. Wound was redressed. Patient will be discharged home with prescriptions for clindamycin. Patient is to follow up with PCP as directed. Patient is given ED precautions to return to the ED for any worsening or new symptoms.     ____________________________________________  FINAL CLINICAL IMPRESSION(S) / ED DIAGNOSES  Final diagnoses:  Laceration of left middle finger without foreign body without damage to nail, initial encounter      NEW MEDICATIONS STARTED DURING THIS VISIT:  Discharge Medication List as of 12/09/2016 12:40 PM    START taking these  medications   Details  clindamycin (CLEOCIN) 300 MG capsule Take 1 capsule (300 mg total) by mouth 4 (four) times daily., Starting Mon 12/09/2016, Until Thu 12/19/2016, Print            This chart was dictated using voice recognition software/Dragon. Despite best efforts to proofread, errors can occur which can change the meaning. Any change was purely unintentional.    Enid DerryWagner, Shelley Cocke, PA-C 12/09/16 1531    Jene EveryKinner, Robert, MD 12/09/16 [phone]

## 2017-03-14 ENCOUNTER — Encounter: Payer: Self-pay | Admitting: *Deleted

## 2017-03-14 ENCOUNTER — Emergency Department
Admission: EM | Admit: 2017-03-14 | Discharge: 2017-03-14 | Disposition: A | Discharge status: Home / Self Care | Payer: Self-pay | Attending: Emergency Medicine | Admitting: Emergency Medicine

## 2017-03-14 ENCOUNTER — Emergency Department: Payer: Self-pay

## 2017-03-14 DIAGNOSIS — I10 Essential (primary) hypertension: Secondary | ICD-10-CM | POA: Insufficient documentation

## 2017-03-14 DIAGNOSIS — Z79899 Other long term (current) drug therapy: Secondary | ICD-10-CM | POA: Insufficient documentation

## 2017-03-14 DIAGNOSIS — F17210 Nicotine dependence, cigarettes, uncomplicated: Secondary | ICD-10-CM | POA: Insufficient documentation

## 2017-03-14 DIAGNOSIS — F17290 Nicotine dependence, other tobacco product, uncomplicated: Secondary | ICD-10-CM | POA: Insufficient documentation

## 2017-03-14 DIAGNOSIS — M79641 Pain in right hand: Secondary | ICD-10-CM | POA: Insufficient documentation

## 2017-03-14 LAB — CBC
HCT: 42.5 % (ref 40.0–52.0)
HEMOGLOBIN: 14.7 g/dL (ref 13.0–18.0)
MCH: 31.5 pg (ref 26.0–34.0)
MCHC: 34.6 g/dL (ref 32.0–36.0)
MCV: 91.1 fL (ref 80.0–100.0)
PLATELETS: 176 10*3/uL (ref 150–440)
RBC: 4.67 MIL/uL (ref 4.40–5.90)
RDW: 14.5 % (ref 11.5–14.5)
WBC: 11.1 10*3/uL — ABNORMAL HIGH (ref 3.8–10.6)

## 2017-03-14 LAB — BASIC METABOLIC PANEL
Anion gap: 7 (ref 5–15)
BUN: 13 mg/dL (ref 6–20)
CO2: 26 mmol/L (ref 22–32)
CREATININE: 1.22 mg/dL (ref 0.61–1.24)
Calcium: 8.9 mg/dL (ref 8.9–10.3)
Chloride: 107 mmol/L (ref 101–111)
Glucose, Bld: 72 mg/dL (ref 65–99)
POTASSIUM: 3.6 mmol/L (ref 3.5–5.1)
SODIUM: 140 mmol/L (ref 135–145)

## 2017-03-14 LAB — URIC ACID: URIC ACID, SERUM: 6.9 mg/dL (ref 4.4–7.6)

## 2017-03-14 MED ORDER — KETOROLAC TROMETHAMINE 60 MG/2ML IM SOLN
30.0000 mg | Freq: Once | INTRAMUSCULAR | Status: AC
  Administered 2017-03-14: 30 mg via INTRAMUSCULAR
  Filled 2017-03-14: qty 2

## 2017-03-14 MED ORDER — ORPHENADRINE CITRATE 30 MG/ML IJ SOLN
60.0000 mg | Freq: Two times a day (BID) | INTRAMUSCULAR | Status: DC
  Administered 2017-03-14: 60 mg via INTRAMUSCULAR
  Filled 2017-03-14: qty 2

## 2017-03-14 MED ORDER — PREDNISONE 10 MG PO TABS: ORAL_TABLET | ORAL | 0 refills | Status: DC

## 2017-03-14 MED ORDER — IBUPROFEN 600 MG PO TABS: 600.0000 mg | ORAL_TABLET | Freq: Four times a day (QID) | ORAL | 0 refills | Status: AC | PRN

## 2017-03-14 MED ORDER — METOPROLOL TARTRATE 25 MG PO TABS: 25.0000 mg | ORAL_TABLET | Freq: Two times a day (BID) | ORAL | 0 refills | Status: AC

## 2017-03-14 NOTE — ED Triage Notes (Signed)
Pt woke up with right hand swelling, no injury

## 2017-03-14 NOTE — ED Provider Notes (Signed)
[facility] Emergency Department Provider Note  ____________________________________________  Time seen: Approximately 6:38 PM  I have reviewed the triage vital signs and the nursing notes.   HISTORY  Chief Complaint Hand Injury    HPI Jonathan Caldwell is a 41 y.o. male who presents to emergency department for evaluation of right hand pain. Patient states that for the last 2-3 weeks he has had numbness and tingling in all of his fingers when he wakes up in the morning. Today the pain and tingling did not go away. He states that it is painful to open his hand all the way. Pain does not radiate into his arm. No injury. He also states that he takes metoprolol for his blood pressure but has been out for 3 months. He does not have a primary care provider since losing his insurance. No fever, headache, visual changes, shortness of breath, chest pain, nausea, vomiting, abdominal pain.   Past Medical History:  Diagnosis Date  . Chronic knee pain   . Hypertension     Patient Active Problem List   Diagnosis Date Noted  . Chest pain 04/26/2016  . Palpitations 04/26/2016  . Tobacco abuse counseling 04/26/2016  . Alcohol abuse 04/26/2016  . Marijuana abuse 04/26/2016  . 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 ALCOHOL ABUSE, HX OF 05/09/2009  . TOBACCO ABUSE, HX OF 05/09/2009  . CHEST PAIN, ATYPICAL, HX OF 05/09/2009    Past Surgical History:  Procedure Laterality Date  . CARDIAC CATHETERIZATION N/A 04/26/2016   Procedure: Left Heart Cath and Coronary Angiography;  Surgeon: Dalia Heading, MD;  Location: [facility] INVASIVE CV LAB;  Service: Cardiovascular;  Laterality: N/A;    Prior to Admission medications   Medication Sig Start Date End Date Taking? Authorizing Provider  etodolac (LODINE) 400 MG tablet Take 1 tablet (400 mg total) by mouth 2 (two) times daily. 10/03/16   Tommi Rumps, PA-C  ibuprofen (ADVIL,MOTRIN) 600 MG tablet Take 1 tablet (600 mg total) by mouth every 6  (six) hours as needed. 03/14/17   Enid Derry, PA-C  metoprolol tartrate (LOPRESSOR) 25 MG tablet Take 1 tablet (25 mg total) by mouth 2 (two) times daily. 03/14/17 03/14/18  Enid Derry, PA-C  ondansetron (ZOFRAN ODT) 4 MG disintegrating tablet Allow 1-2 tablets to dissolve in your mouth every 8 hours as needed for nausea/vomiting 09/16/16   Loleta Rose, MD  predniSONE (DELTASONE) 10 MG tablet Take 6 tablets on day 1, take 5 tablets on day 2, take 4 tablets on day 3, take 3 tablets on day 4, take 2 tablets on day 5, take 1 tablet on day 6 03/14/17   Enid Derry, PA-C    Allergies Patient has no known allergies.  No family history on file.  Social History Social History  Substance Use Topics  . Smoking status: Current Every Day Smoker    Packs/day: 0.50    Years: 5.00    Types: Cigarettes, Cigars  . Smokeless tobacco: Never Used  . Alcohol use Yes     Comment: occ     Review of Systems  Constitutional: No fever/chills Cardiovascular: No chest pain. Respiratory:  No SOB. Gastrointestinal: No abdominal pain.  No nausea, no vomiting.  Musculoskeletal: Positive for hand pain. Skin: Negative for rash, abrasions, lacerations, ecchymosis.   ____________________________________________   PHYSICAL EXAM:  VITAL SIGNS: ED Triage Vitals  Enc Vitals Group     BP 03/14/17 1604 (!) 137/92     Pulse Rate 03/14/17 1602 86  Resp 03/14/17 1602 18     Temp 03/14/17 1602 98.6 F (37 C)     Temp Source 03/14/17 1602 Oral     SpO2 03/14/17 1602 100 %     Weight 03/14/17 1603 222 lb (100.7 kg)     Height 03/14/17 1603 6' (1.829 m)     Head Circumference --      Peak Flow --      Pain Score 03/14/17 1602 9     Pain Loc --      Pain Edu? --      Excl. in GC? --      Constitutional: Alert and oriented. Well appearing and in no acute distress. Eyes: Conjunctivae are normal. PERRL. EOMI. Head: Atraumatic. ENT:      Ears:      Nose: No congestion/rhinnorhea.       Mouth/Throat: Mucous membranes are moist.  Neck: No stridor.  Cardiovascular: Normal rate, regular rhythm.  Good peripheral circulation. 2+ radial pulses. Respiratory: Normal respiratory effort without tachypnea or retractions. Lungs CTAB. Good air entry to the bases with no decreased or absent breath sounds. Musculoskeletal: No gross deformities appreciated. Pain with extension of right fingers. Negative Tinel's and Fallon's. No swelling, bruising, erythema. Neurologic:  Normal speech and language. No gross focal neurologic deficits are appreciated.  Skin:  Skin is warm, dry and intact. No rash noted.   ____________________________________________   LABS (all labs ordered are listed, but only abnormal results are displayed)  Labs Reviewed  CBC - Abnormal; Notable for the following:       Result Value   WBC 11.1 (*)    All other components within normal limits  BASIC METABOLIC PANEL  URIC ACID   ____________________________________________  EKG   ____________________________________________  RADIOLOGY Lexine Baton, personally viewed and evaluated these images (plain radiographs) as part of my medical decision making, as well as reviewing the written report by the radiologist.  Dg Hand Complete Right  Result Date: 03/14/2017 CLINICAL DATA:  Right hand pain and swelling without known injury. EXAM: RIGHT HAND - COMPLETE 3+ VIEW COMPARISON:  None. FINDINGS: There is no evidence of fracture or dislocation. There is no evidence of arthropathy or other focal bone abnormality. Soft tissues are unremarkable. IMPRESSION: Normal right hand. Electronically Signed   By: Lupita Raider, M.D.   On: 03/14/2017 17:04    ____________________________________________    PROCEDURES  Procedure(s) performed:    Procedures    Medications  orphenadrine (NORFLEX) injection 60 mg (60 mg Intramuscular Given 03/14/17 1813)  ketorolac (TORADOL) injection 30 mg (30 mg Intramuscular Given  03/14/17 1815)     ____________________________________________   INITIAL IMPRESSION / ASSESSMENT AND PLAN / ED COURSE  Pertinent labs & imaging results that were available during my care of the patient were reviewed by me and considered in my medical decision making (see chart for details).  Review of the [place] CSRS was performed in accordance of the [facility] prior to dispensing any controlled drugs.   She presented to the emergency department for evaluation of hand pain. Vital signs, labwork, and exam are reassuring. X-ray negative for bony abnormalities. Morning symptoms of numbness and tingling are consistent with carpal tunnel so we will try a course of prednisone and ibuprofen. I will also give him a prescription for 1 month of his blood pressure medication. Resources for primary care at [facility] was provided. Patient will be discharged home with prescriptions for prednisone, ibuprofen, metoprolol. Patient is to follow up  with PCP as directed. Patient is given ED precautions to return to the ED for any worsening or new symptoms.     ____________________________________________  FINAL CLINICAL IMPRESSION(S) / ED DIAGNOSES  Final diagnoses:  Pain of right hand  Hypertension, unspecified type      NEW MEDICATIONS STARTED DURING THIS VISIT:  Discharge Medication List as of 03/14/2017  6:53 PM    START taking these medications   Details  ibuprofen (ADVIL,MOTRIN) 600 MG tablet Take 1 tablet (600 mg total) by mouth every 6 (six) hours as needed., Starting Fri 03/14/2017, Print    predniSONE (DELTASONE) 10 MG tablet Take 6 tablets on day 1, take 5 tablets on day 2, take 4 tablets on day 3, take 3 tablets on day 4, take 2 tablets on day 5, take 1 tablet on day 6, Print            This chart was dictated using voice recognition software/Dragon. Despite best efforts to proofread, errors can occur which can change the meaning. Any change was purely unintentional.    Enid Derry, PA-C 03/14/17 2317    Sharman Cheek, MD 03/15/17 0001

## 2017-03-14 NOTE — ED Notes (Signed)
See triage note  States he woke up with pain/swellingto right hand this am   Denies any injury and states he had no diff with hand last pm

## 2017-05-23 ENCOUNTER — Emergency Department
Admission: EM | Admit: 2017-05-23 | Discharge: 2017-05-23 | Disposition: A | Discharge status: Home / Self Care | Payer: PRIVATE HEALTH INSURANCE | Attending: Emergency Medicine | Admitting: Emergency Medicine

## 2017-05-23 DIAGNOSIS — I10 Essential (primary) hypertension: Secondary | ICD-10-CM | POA: Insufficient documentation

## 2017-05-23 DIAGNOSIS — J029 Acute pharyngitis, unspecified: Secondary | ICD-10-CM | POA: Insufficient documentation

## 2017-05-23 DIAGNOSIS — Z79899 Other long term (current) drug therapy: Secondary | ICD-10-CM | POA: Insufficient documentation

## 2017-05-23 DIAGNOSIS — F17210 Nicotine dependence, cigarettes, uncomplicated: Secondary | ICD-10-CM | POA: Insufficient documentation

## 2017-05-23 LAB — POCT RAPID STREP A: Streptococcus, Group A Screen (Direct): NEGATIVE

## 2017-05-23 MED ORDER — DIPHENHYDRAMINE HCL 12.5 MG/5ML PO SYRP: 12.5000 mg | ORAL_SOLUTION | Freq: Four times a day (QID) | ORAL | 0 refills | Status: DC | PRN

## 2017-05-23 MED ORDER — LIDOCAINE VISCOUS 2 % MT SOLN: 5.0000 mL | Freq: Four times a day (QID) | OROMUCOSAL | 0 refills | Status: DC | PRN

## 2017-05-23 NOTE — ED Notes (Signed)
NAD noted at time of D/C. Pt denies questions or concerns. Pt ambulatory to the lobby at this time.  

## 2017-05-23 NOTE — ED Provider Notes (Signed)
[facility] Emergency Department Provider Note   ____________________________________________   First MD Initiated Contact with Patient 05/23/17 [phone]     (approximate)  I have reviewed the triage vital signs and the nursing notes.   HISTORY  Chief Complaint Sore Throat    HPI Jonathan Caldwell is a 41 y.o. male patient complaining of sore throat on the left side which occurred last night. Patient stated mild pain with swallowing. Patient denies fever or chills. Patient state there is nasal congestion and coughing.Patient rated pain discomfort as a 9/10. Patient described pain as "sore". No palliative measures for complaint.   Past Medical History:  Diagnosis Date  . Chronic knee pain   . Hypertension     Patient Active Problem List   Diagnosis Date Noted  . Chest pain 04/26/2016  . Palpitations 04/26/2016  . Tobacco abuse counseling 04/26/2016  . Alcohol abuse 04/26/2016  . Marijuana abuse 04/26/2016  . 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 ALCOHOL ABUSE, HX OF 05/09/2009  . TOBACCO ABUSE, HX OF 05/09/2009  . CHEST PAIN, ATYPICAL, HX OF 05/09/2009    Past Surgical History:  Procedure Laterality Date  . CARDIAC CATHETERIZATION N/A 04/26/2016   Procedure: Left Heart Cath and Coronary Angiography;  Surgeon: Dalia Heading, MD;  Location: [facility] INVASIVE CV LAB;  Service: Cardiovascular;  Laterality: N/A;    Prior to Admission medications   Medication Sig Start Date End Date Taking? Authorizing Provider  diphenhydrAMINE (BENYLIN) 12.5 MG/5ML syrup Take 5 mLs (12.5 mg total) by mouth 4 (four) times daily as needed for allergies. Mixed with viscous lidocaine for swish and swallow 05/23/17   Joni Reining, PA-C  etodolac (LODINE) 400 MG tablet Take 1 tablet (400 mg total) by mouth 2 (two) times daily. 10/03/16   Tommi Rumps, PA-C  ibuprofen (ADVIL,MOTRIN) 600 MG tablet Take 1 tablet (600 mg total) by mouth every 6 (six) hours as needed. 03/14/17   Enid Derry, PA-C  lidocaine (XYLOCAINE) 2 % solution Use as directed 5 mLs in the mouth or throat every 6 (six) hours as needed for mouth pain. Mixed with Benadryl elixir for swish and swallow 05/23/17   Joni Reining, PA-C  metoprolol tartrate (LOPRESSOR) 25 MG tablet Take 1 tablet (25 mg total) by mouth 2 (two) times daily. 03/14/17 03/14/18  Enid Derry, PA-C  ondansetron (ZOFRAN ODT) 4 MG disintegrating tablet Allow 1-2 tablets to dissolve in your mouth every 8 hours as needed for nausea/vomiting 09/16/16   Loleta Rose, MD  predniSONE (DELTASONE) 10 MG tablet Take 6 tablets on day 1, take 5 tablets on day 2, take 4 tablets on day 3, take 3 tablets on day 4, take 2 tablets on day 5, take 1 tablet on day 6 03/14/17   Enid Derry, PA-C    Allergies Patient has no known allergies.  No family history on file.  Social History Social History  Substance Use Topics  . Smoking status: Current Every Day Smoker    Packs/day: 0.50    Years: 5.00    Types: Cigarettes, Cigars  . Smokeless tobacco: Never Used  . Alcohol use Yes     Comment: occ    Review of Systems Constitutional: No fever/chills Eyes: No visual changes. ENT: Sore throat  Cardiovascular: Denies chest pain. Respiratory: Denies shortness of breath. Gastrointestinal: No abdominal pain.  No nausea, no vomiting.  No diarrhea.  No constipation. Genitourinary: Negative for dysuria. Musculoskeletal: Negative for back pain. Skin: Negative for rash. Neurological:  Negative for headaches, focal weakness or numbness. Psychiatric:Alcohol drug abuse Endocrine:Hypertension   ____________________________________________   PHYSICAL EXAM:  VITAL SIGNS: ED Triage Vitals [05/23/17 0855]  Enc Vitals Group     BP (!) 149/90     Pulse Rate 84     Resp 18     Temp 98.5 F (36.9 C)     Temp Source Oral     SpO2 98 %     Weight 214 lb (97.1 kg)     Height 6' (1.829 m)     Head Circumference      Peak Flow      Pain Score 9      Pain Loc      Pain Edu?      Excl. in GC?    Constitutional: Alert and oriented. Well appearing and in no acute distress. Nose: No congestion/rhinnorhea. Mouth/Throat: Mucous membranes are moist.  Oropharynx non-erythematous. Edematous non-exudative tonsils Neck: No stridor.   No cervical spine tenderness to palpation. Hematological/Lymphatic/Immunilogical: No cervical lymphadenopathy. Cardiovascular: Normal rate, regular rhythm. Grossly normal heart sounds.  Good peripheral circulation. Respiratory: Normal respiratory effort.  No retractions. Lungs CTAB. Gastrointestinal: Soft and nontender. No distention. No abdominal bruits. No CVA tenderness. }Musculoskeletal: No lower extremity tenderness nor edema.  No joint effusions. Neurologic:  Normal speech and language. No gross focal neurologic deficits are appreciated. No gait instability. Skin:  Skin is warm, dry and intact. No rash noted. Psychiatric: Mood and affect are normal. Speech and behavior are normal.  ____________________________________________   LABS (all labs ordered are listed, but only abnormal results are displayed)  Labs Reviewed  POCT RAPID STREP A   ____________________________________________  EKG   ____________________________________________  RADIOLOGY  No results found.  ____________________________________________   PROCEDURES  Procedure(s) performed: None  Procedures  Critical Care performed: No  ____________________________________________   INITIAL IMPRESSION / ASSESSMENT AND PLAN / ED COURSE  As part of my medical decision making, I reviewed the following data within the electronic MEDICAL RECORD NUMBER   Viral pharyngitis. Patient presented sore throat started last night. Patient is afebrile and content with food and fluids. Rapid strep test was negative and culture is pending. Patient given discharge care instruction past take medication as needed. Patient given a work note for  today.      ____________________________________________   FINAL CLINICAL IMPRESSION(S) / ED DIAGNOSES  Final diagnoses:  Viral pharyngitis      NEW MEDICATIONS STARTED DURING THIS VISIT:  New Prescriptions   DIPHENHYDRAMINE (BENYLIN) 12.5 MG/5ML SYRUP    Take 5 mLs (12.5 mg total) by mouth 4 (four) times daily as needed for allergies. Mixed with viscous lidocaine for swish and swallow   LIDOCAINE (XYLOCAINE) 2 % SOLUTION    Use as directed 5 mLs in the mouth or throat every 6 (six) hours as needed for mouth pain. Mixed with Benadryl elixir for swish and swallow     Note:  This document was prepared using Dragon voice recognition software and may include unintentional dictation errors.    Joni ReiningSmith, Damoni Erker K, PA-C 05/23/17 1005    Schaevitz, Myra Rudeavid Matthew, MD 05/23/17 1047

## 2017-05-23 NOTE — ED Triage Notes (Signed)
Left sided sore throat that started last night. Pt alert and oriented X4, active, cooperative, pt in NAD. RR even and unlabored, color WNL.

## 2017-05-26 ENCOUNTER — Emergency Department
Admission: EM | Admit: 2017-05-26 | Discharge: 2017-05-26 | Disposition: A | Discharge status: Home / Self Care | Payer: No Typology Code available for payment source | Attending: Emergency Medicine | Admitting: Emergency Medicine

## 2017-05-26 ENCOUNTER — Other Ambulatory Visit: Payer: Self-pay

## 2017-05-26 DIAGNOSIS — F1210 Cannabis abuse, uncomplicated: Secondary | ICD-10-CM | POA: Insufficient documentation

## 2017-05-26 DIAGNOSIS — Z79899 Other long term (current) drug therapy: Secondary | ICD-10-CM | POA: Insufficient documentation

## 2017-05-26 DIAGNOSIS — I10 Essential (primary) hypertension: Secondary | ICD-10-CM | POA: Insufficient documentation

## 2017-05-26 DIAGNOSIS — J029 Acute pharyngitis, unspecified: Secondary | ICD-10-CM

## 2017-05-26 DIAGNOSIS — F17210 Nicotine dependence, cigarettes, uncomplicated: Secondary | ICD-10-CM | POA: Insufficient documentation

## 2017-05-26 LAB — POCT RAPID STREP A: Streptococcus, Group A Screen (Direct): NEGATIVE

## 2017-05-26 MED ORDER — DIPHENHYDRAMINE HCL 12.5 MG/5ML PO SYRP: 12.5000 mg | ORAL_SOLUTION | Freq: Four times a day (QID) | ORAL | 0 refills | Status: AC | PRN

## 2017-05-26 MED ORDER — PREDNISONE 10 MG PO TABS: ORAL_TABLET | ORAL | 0 refills | Status: AC

## 2017-05-26 MED ORDER — DEXAMETHASONE SODIUM PHOSPHATE 10 MG/ML IJ SOLN
10.0000 mg | Freq: Once | INTRAMUSCULAR | Status: AC
  Administered 2017-05-26: 10 mg via INTRAMUSCULAR
  Filled 2017-05-26: qty 1

## 2017-05-26 MED ORDER — LIDOCAINE VISCOUS 2 % MT SOLN: 5.0000 mL | Freq: Four times a day (QID) | OROMUCOSAL | 0 refills | Status: AC | PRN

## 2017-05-26 NOTE — ED Provider Notes (Signed)
[facility] Emergency Department Provider Note   ____________________________________________   First MD Initiated Contact with Patient 05/26/17 [phone]     (approximate)  I have reviewed the triage vital signs and the nursing notes.   HISTORY  Chief Complaint Sore Throat    HPI Jonathan Caldwell is a 41 y.o. male patient presents with continued and worsening of sore throat since last visit 4 days ago. Patient had a rapid strep test which was negative. Patient state he can only tolerate fluids. Patient states only mild relief with viscous lidocaine. Patient rates pain as 10 over 10. No other palliative measures for complaint.   Past Medical History:  Diagnosis Date  . Chronic knee pain   . Hypertension     Patient Active Problem List   Diagnosis Date Noted  . Chest pain 04/26/2016  . Palpitations 04/26/2016  . Tobacco abuse counseling 04/26/2016  . Alcohol abuse 04/26/2016  . Marijuana abuse 04/26/2016  . 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 ALCOHOL ABUSE, HX OF 05/09/2009  . TOBACCO ABUSE, HX OF 05/09/2009  . CHEST PAIN, ATYPICAL, HX OF 05/09/2009    No past surgical history on file.  Prior to Admission medications   Medication Sig Start Date End Date Taking? Authorizing Provider  diphenhydrAMINE (BENYLIN) 12.5 MG/5ML syrup Take 5 mLs (12.5 mg total) 4 (four) times daily as needed by mouth. Mixed with viscous lidocaine for swish and swallow 05/26/17   Joni Reining, PA-C  etodolac (LODINE) 400 MG tablet Take 1 tablet (400 mg total) by mouth 2 (two) times daily. 10/03/16   Tommi Rumps, PA-C  ibuprofen (ADVIL,MOTRIN) 600 MG tablet Take 1 tablet (600 mg total) by mouth every 6 (six) hours as needed. 03/14/17   Enid Derry, PA-C  lidocaine (XYLOCAINE) 2 % solution Use as directed 5 mLs every 6 (six) hours as needed in the mouth or throat for mouth pain. Mixed with Benadryl elixir for swish and swallow 05/26/17   Joni Reining, PA-C  metoprolol  tartrate (LOPRESSOR) 25 MG tablet Take 1 tablet (25 mg total) by mouth 2 (two) times daily. 03/14/17 03/14/18  Enid Derry, PA-C  ondansetron (ZOFRAN ODT) 4 MG disintegrating tablet Allow 1-2 tablets to dissolve in your mouth every 8 hours as needed for nausea/vomiting 09/16/16   Loleta Rose, MD  predniSONE (DELTASONE) 10 MG tablet Take 6 tablets on day 1, take 5 tablets on day 2, take 4 tablets on day 3, take 3 tablets on day 4, take 2 tablets on day 5, take 1 tablet on day 6 05/26/17   Joni Reining, PA-C    Allergies Patient has no known allergies.  No family history on file.  Social History Social History   Tobacco Use  . Smoking status: Current Every Day Smoker    Packs/day: 0.50    Years: 5.00    Pack years: 2.50    Types: Cigarettes, Cigars  . Smokeless tobacco: Never Used  Substance Use Topics  . Alcohol use: Yes    Comment: occ  . Drug use: Yes    Types: Marijuana    Review of Systems Constitutional: No fever/chills Eyes: No visual changes. ENT: Sore throat  Cardiovascular: Denies chest pain. Respiratory: Denies shortness of breath. Gastrointestinal: No abdominal pain.  No nausea, no vomiting.  No diarrhea.  No constipation. Genitourinary: Negative for dysuria. Musculoskeletal: Negative for back pain. Skin: Negative for rash. Neurological: Negative for headaches, focal weakness or numbness. Psychiatric:Alcohol drug abuse  ____________________________________________  PHYSICAL EXAM:  VITAL SIGNS: ED Triage Vitals  Enc Vitals Group     BP 05/26/17 0703 (!) 126/98     Pulse Rate 05/26/17 0703 93     Resp 05/26/17 0703 18     Temp 05/26/17 0703 98.2 F (36.8 C)     Temp Source 05/26/17 0703 Oral     SpO2 05/26/17 0703 98 %     Weight 05/26/17 0700 214 lb (97.1 kg)     Height 05/26/17 0700 6' (1.829 m)     Head Circumference --      Peak Flow --      Pain Score 05/26/17 0659 10     Pain Loc --      Pain Edu? --      Excl. in GC? --     Constitutional: Alert and oriented. Well appearing and in no acute distress. Mouth/Throat: Mucous membranes are moist.  Oropharynx -erythematous. Edematous and non-exudative tonsils Neck: No stridor. Hematological/Lymphatic/Immunilogical: No cervical lymphadenopathy. Cardiovascular: Normal rate, regular rhythm. Grossly normal heart sounds.  Good peripheral circulation. Respiratory: Normal respiratory effort.  No retractions. Lungs CTAB. Neurologic:  Normal speech and language. No gross focal neurologic deficits are appreciated. No gait instability. Skin:  Skin is warm, dry and intact. No rash noted. Psychiatric: Mood and affect are normal. Speech and behavior are normal.  ____________________________________________   LABS (all labs ordered are listed, but only abnormal results are displayed)  Labs Reviewed  POCT RAPID STREP A   ____________________________________________  EKG   ____________________________________________  RADIOLOGY  No results found.  ____________________________________________   PROCEDURES  Procedure(s) performed: None  Procedures  Critical Care performed: No  ____________________________________________   INITIAL IMPRESSION / ASSESSMENT AND PLAN / ED COURSE  As part of my medical decision making, I reviewed the following data within the electronic MEDICAL RECORD NUMBER    Sore throat secondary to viral etiology. Patient again was negative rapid strep test. Patient given discharge care instructions. Patient given a work no. Patient advised take medication as directed. [facility] if condition [facility].      ____________________________________________   FINAL CLINICAL IMPRESSION(S) / ED DIAGNOSES  Final diagnoses:  Viral pharyngitis      Note:  This document was prepared using Dragon voice recognition software and may include unintentional dictation errors.    Joni ReiningSmith, Ronald K,  PA-C 05/26/17 [id number]    Emily FilbertWilliams, Jonathan E, MD 05/26/17 [id number]

## 2017-05-26 NOTE — ED Triage Notes (Signed)
Pt says he was here on Thursday for sore throat and diagnosed with a virus; strep swab negative; pt presents this morning with continued sore throat; having difficulty swallowing for the last 2 days; says anything solid, and thicker liquids come right back up; he can tolerate thin liquids only; denies fever; pt c/o throat tightness;

## 2017-10-24 IMAGING — DX DG CHEST 1V PORT
1 series · 1 of 1 positions shown · non-contrast
Comparison: 04/28/2015

CLINICAL DATA: LEFT-sided chest pain was short of breath

EXAM:
PORTABLE CHEST 1 VIEW

[chest ap]
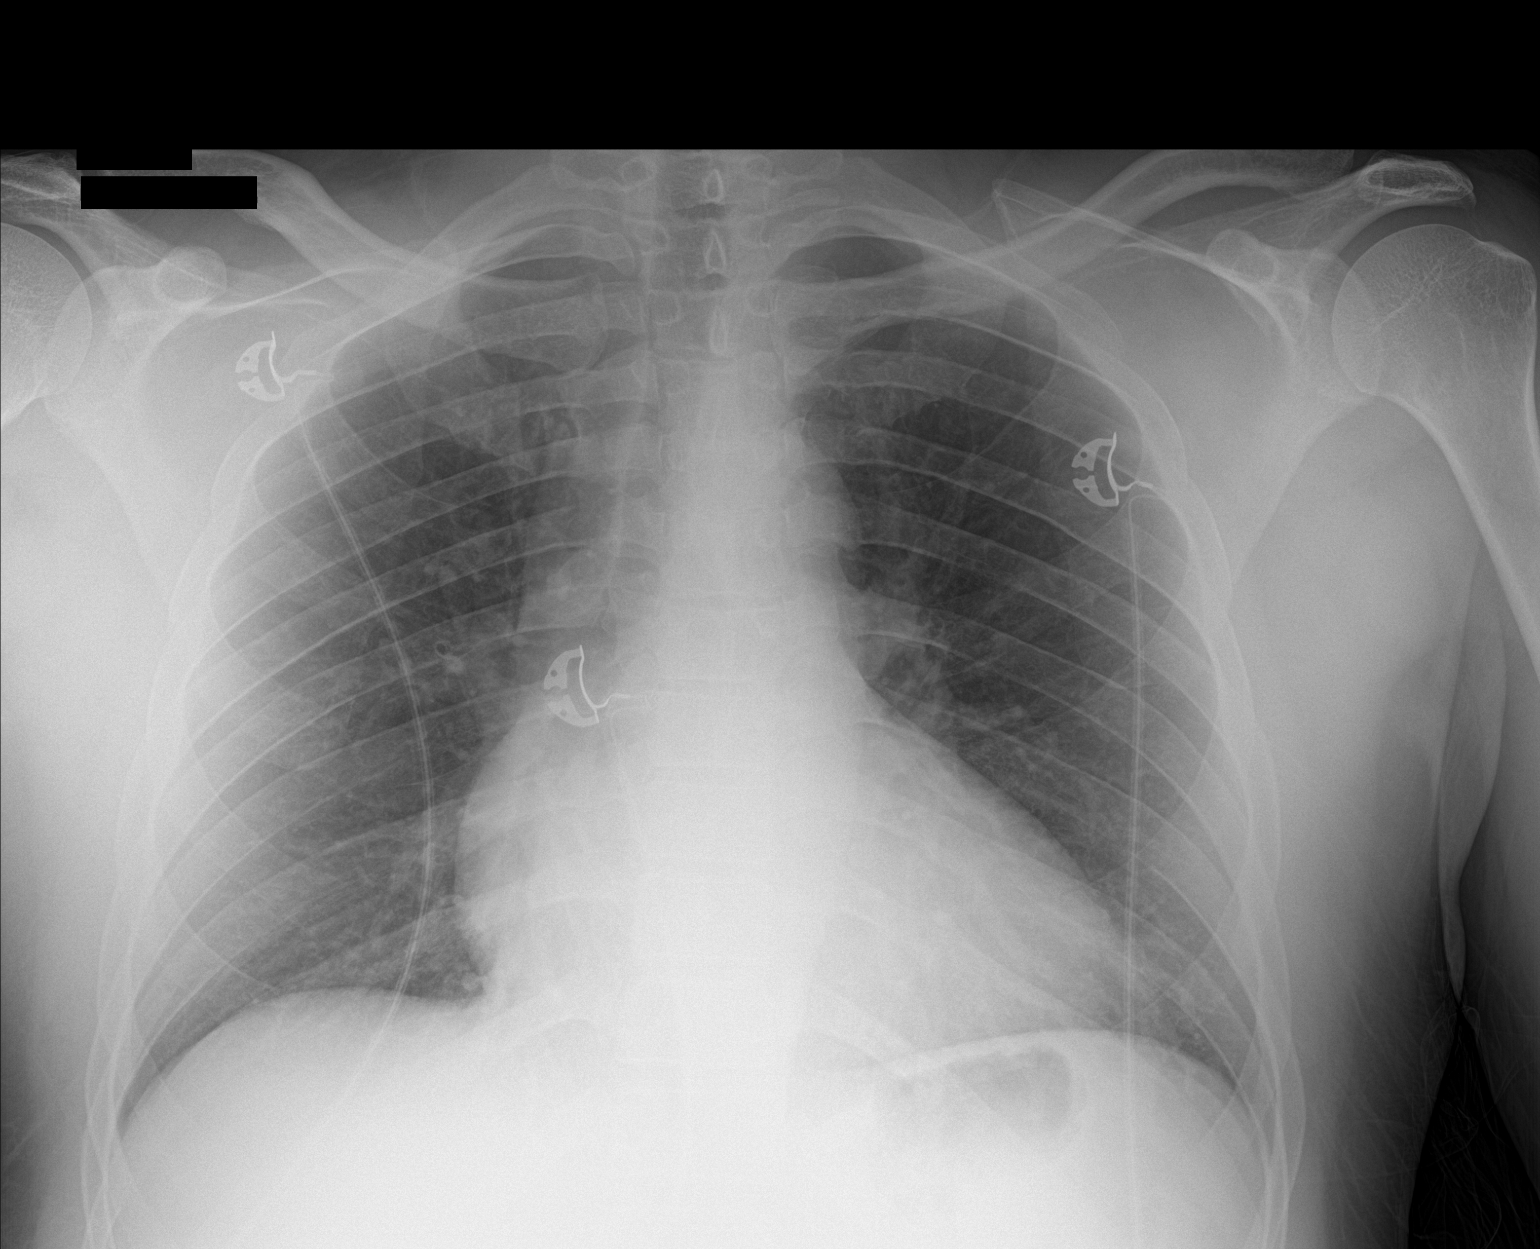

[1 of 1 positions shown; findings below may reference images not displayed]

FINDINGS: Normal mediastinum and cardiac silhouette. Normal pulmonary
vasculature. No evidence of effusion, infiltrate, or pneumothorax.
No acute bony abnormality.
IMPRESSION: No acute cardiopulmonary process.

## 2020-04-04 ENCOUNTER — Other Ambulatory Visit: Payer: Self-pay

## 2020-04-04 ENCOUNTER — Emergency Department
Admission: EM | Admit: 2020-04-04 | Discharge: 2020-04-04 | Disposition: A | Discharge status: Home / Self Care | Payer: No Typology Code available for payment source | Attending: Emergency Medicine | Admitting: Emergency Medicine

## 2020-04-04 DIAGNOSIS — Z79899 Other long term (current) drug therapy: Secondary | ICD-10-CM | POA: Insufficient documentation

## 2020-04-04 DIAGNOSIS — Z760 Encounter for issue of repeat prescription: Secondary | ICD-10-CM

## 2020-04-04 DIAGNOSIS — F17210 Nicotine dependence, cigarettes, uncomplicated: Secondary | ICD-10-CM | POA: Insufficient documentation

## 2020-04-04 DIAGNOSIS — I10 Essential (primary) hypertension: Secondary | ICD-10-CM | POA: Insufficient documentation

## 2020-04-04 DIAGNOSIS — R9431 Abnormal electrocardiogram [ECG] [EKG]: Secondary | ICD-10-CM | POA: Insufficient documentation

## 2020-04-04 DIAGNOSIS — R42 Dizziness and giddiness: Secondary | ICD-10-CM

## 2020-04-04 LAB — BASIC METABOLIC PANEL
Anion gap: 9 (ref 5–15)
BUN: 13 mg/dL (ref 6–20)
CO2: 25 mmol/L (ref 22–32)
Calcium: 9.2 mg/dL (ref 8.9–10.3)
Chloride: 108 mmol/L (ref 98–111)
Creatinine, Ser: 1.13 mg/dL (ref 0.61–1.24)
GFR calc Af Amer: >60 mL/min (ref >60)
GFR calc non Af Amer: >60 mL/min (ref >60)
Glucose, Bld: 77 mg/dL (ref 70–99)
Potassium: 3.8 mmol/L (ref 3.5–5.1)
Sodium: 142 mmol/L (ref 135–145)

## 2020-04-04 LAB — URINALYSIS, COMPLETE (UACMP) WITH MICROSCOPIC
Bilirubin Urine: NEGATIVE
Glucose, UA: NEGATIVE mg/dL
Hgb urine dipstick: NEGATIVE
Ketones, ur: NEGATIVE mg/dL
Nitrite: NEGATIVE
Protein, ur: NEGATIVE mg/dL
Specific Gravity, Urine: 1.025 (ref 1.005–1.030)
pH: 5 (ref 5.0–8.0)

## 2020-04-04 LAB — CBC
HCT: 45.3 % (ref 39.0–52.0)
Hemoglobin: 15.5 g/dL (ref 13.0–17.0)
MCH: 30.9 pg (ref 26.0–34.0)
MCHC: 34.2 g/dL (ref 30.0–36.0)
MCV: 90.2 fL (ref 80.0–100.0)
Platelets: 206 10*3/uL (ref 150–400)
RBC: 5.02 MIL/uL (ref 4.22–5.81)
RDW: 14.3 % (ref 11.5–15.5)
WBC: 11.8 10*3/uL — ABNORMAL HIGH (ref 4.0–10.5)
nRBC: 0 % (ref 0.0–0.2)

## 2020-04-04 LAB — TROPONIN I (HIGH SENSITIVITY)
Troponin I (High Sensitivity): 8 ng/L (ref <18)
Troponin I (High Sensitivity): 7 ng/L (ref <18)

## 2020-04-04 MED ORDER — METOPROLOL TARTRATE 25 MG PO TABS: 25.0000 mg | ORAL_TABLET | Freq: Two times a day (BID) | ORAL | 11 refills | Status: AC

## 2020-04-04 MED ORDER — METOPROLOL TARTRATE 25 MG PO TABS
25.0000 mg | ORAL_TABLET | Freq: Once | ORAL | Status: AC
  Administered 2020-04-04: 19:00:00 25 mg via ORAL
  Filled 2020-04-04: qty 1

## 2020-04-04 NOTE — Discharge Instructions (Signed)
Please take your metoprolol 25 mg 1 twice a day.  Please return here for any further symptoms of weakness or unsteadiness or dizziness.  As I mentioned here your EKG is markedly abnormal.  It has been abnormal for quite some time even back in 2017 when you had your heart cath.  It is slightly worse now.  I would like you to follow-up with cardiology for recheck.  You can either see Dr. Lady Gary who did your cath back in 2017 or Dr. Cristal Deer who is on-call now.  I have included both of their contact information.  Please call 1 tomorrow and let them know you were in the emergency room and the doctor was worried about your EKG.  You can read them the instructions I typed if you want.

## 2020-04-04 NOTE — ED Provider Notes (Signed)
[facility] Emergency Department Provider Note   ____________________________________________   First MD Initiated Contact with Patient 04/04/20 1620     (approximate)  I have reviewed the triage vital signs and the nursing notes.   HISTORY  Chief Complaint Dizziness and Hypertension    HPI Jonathan Caldwell is a 44 y.o. male patient reports he was at work.  There is no air conditioning in the building.  He was drinking water did not eat breakfast.  He has a history of high blood pressure has been out of his medications for 3 days.  He said his initial blood pressure was like 145 systolic.  He said because of that they made him come in for check.  His hands were shaking a little bit too.  Right now he is fine.  Blood pressure is 175/106 though.  He says he has a doctor in [place]..  Patient has no nausea or vomiting.  He is not short of breath.  He has no chest pain or tightness or arm pain or neck pain.  He did have a very mild headache earlier not now.         Past Medical History:  Diagnosis Date   Chronic knee pain    Hypertension     Patient Active Problem List   Diagnosis Date Noted   Chest pain 04/26/2016   Palpitations 04/26/2016   Tobacco abuse counseling 04/26/2016   Alcohol abuse 04/26/2016   Marijuana abuse 04/26/2016   BRADYCARDIA 05/09/2009   ALCOHOL ABUSE, HX OF 05/09/2009   TOBACCO ABUSE, HX OF 05/09/2009   CHEST PAIN, ATYPICAL, HX OF 05/09/2009    Past Surgical History:  Procedure Laterality Date   CARDIAC CATHETERIZATION N/A 04/26/2016   Procedure: Left Heart Cath and Coronary Angiography;  Surgeon: Dalia Heading, MD;  Location: [facility] INVASIVE CV LAB;  Service: Cardiovascular;  Laterality: N/A;    Prior to Admission medications   Medication Sig Start Date End Date Taking? Authorizing Provider  diphenhydrAMINE (BENYLIN) 12.5 MG/5ML syrup Take 5 mLs (12.5 mg total) 4 (four) times daily as needed by mouth. Mixed  with viscous lidocaine for swish and swallow 05/26/17   Joni Reining, PA-C  etodolac (LODINE) 400 MG tablet Take 1 tablet (400 mg total) by mouth 2 (two) times daily. 10/03/16   Tommi Rumps, PA-C  ibuprofen (ADVIL,MOTRIN) 600 MG tablet Take 1 tablet (600 mg total) by mouth every 6 (six) hours as needed. 03/14/17   Enid Derry, PA-C  lidocaine (XYLOCAINE) 2 % solution Use as directed 5 mLs every 6 (six) hours as needed in the mouth or throat for mouth pain. Mixed with Benadryl elixir for swish and swallow 05/26/17   Joni Reining, PA-C  metoprolol tartrate (LOPRESSOR) 25 MG tablet Take 1 tablet (25 mg total) by mouth 2 (two) times daily. 03/14/17 03/14/18  Enid Derry, PA-C  metoprolol tartrate (LOPRESSOR) 25 MG tablet Take 1 tablet (25 mg total) by mouth 2 (two) times daily. 04/04/20 04/04/21  Arnaldo Natal, MD  ondansetron (ZOFRAN ODT) 4 MG disintegrating tablet Allow 1-2 tablets to dissolve in your mouth every 8 hours as needed for nausea/vomiting 09/16/16   Loleta Rose, MD  predniSONE (DELTASONE) 10 MG tablet Take 6 tablets on day 1, take 5 tablets on day 2, take 4 tablets on day 3, take 3 tablets on day 4, take 2 tablets on day 5, take 1 tablet on day 6 05/26/17   Joni Reining, PA-C  Allergies Patient has no known allergies.  No family history on file.  Social History Social History   Tobacco Use   Smoking status: Current Every Day Smoker    Packs/day: 0.10    Years: 5.00    Pack years: 0.50    Types: Cigarettes, Cigars   Smokeless tobacco: Never Used  Substance Use Topics   Alcohol use: Yes    Comment: occ   Drug use: Yes    Types: Marijuana    Review of Systems  Constitutional: No fever/chills Eyes: No visual changes. ENT: No sore throat. Cardiovascular: Denies chest pain. Respiratory: Denies shortness of breath. Gastrointestinal: No abdominal pain.  No nausea, no vomiting.  No diarrhea.  No constipation. Genitourinary: Negative for  dysuria. Musculoskeletal: Negative for back pain. Skin: Negative for rash. Neurological: Negative for headaches, focal weakness   ____________________________________________   PHYSICAL EXAM:  VITAL SIGNS: ED Triage Vitals [04/04/20 1508]  Enc Vitals Group     BP (!) 163/104     Pulse Rate 64     Resp 18     Temp 98.2 F (36.8 C)     Temp Source Oral     SpO2 99 %     Weight 220 lb (99.8 kg)     Height 6' (1.829 m)     Head Circumference      Peak Flow      Pain Score 0     Pain Loc      Pain Edu?      Excl. in GC?     Constitutional: Alert and oriented. Well appearing and in no acute distress. Eyes: Conjunctivae are normal. PER. EOMI. Head: Atraumatic. Nose: No congestion/rhinnorhea. Mouth/Throat: Mucous membranes are moist.  Oropharynx non-erythematous. Neck: No stridor.   Cardiovascular: Normal rate, regular rhythm. Grossly normal heart sounds.  Good peripheral circulation. Respiratory: Normal respiratory effort.  No retractions. Lungs CTAB. Gastrointestinal: Soft and nontender. No distention. No abdominal bruits. No CVA tenderness. Musculoskeletal: No lower extremity tenderness nor edema.   Neurologic:  Normal speech and language. No gross focal neurologic deficits are appreciated.  Cranial nerves II through XII are intact of the visual fields were not checked.  Cerebellar finger-nose and rapid alternating movements are normal.  Patient does not report any numbness and motor strength is 5/5 throughout.  No gait instability. Skin:  Skin is warm, dry and intact. No rash noted.   ____________________________________________   LABS (all labs ordered are listed, but only abnormal results are displayed)  Labs Reviewed  CBC - Abnormal; Notable for the following components:      Result Value   WBC 11.8 (*)    All other components within normal limits  URINALYSIS, COMPLETE (UACMP) WITH MICROSCOPIC - Abnormal; Notable for the following components:   Color, Urine  YELLOW (*)    APPearance HAZY (*)    Leukocytes,Ua SMALL (*)    Bacteria, UA RARE (*)    All other components within normal limits  BASIC METABOLIC PANEL  TROPONIN I (HIGH SENSITIVITY)  TROPONIN I (HIGH SENSITIVITY)   ____________________________________________  EKG  EKG read and interpreted by me shows normal sinus rhythm rate of 61 normal axis he has flipped T waves and some ST segment depression in multiple leads especially inferiorly and in the lateral chest leads.  These changes are present to various degrees on multiple old EKGs. ____________________________________________  RADIOLOGY  ED MD interpretation:   Official radiology report(s): No results found.  ____________________________________________   PROCEDURES  Procedure(s) performed (  including Critical Care):  Procedures   ____________________________________________   INITIAL IMPRESSION / ASSESSMENT AND PLAN / ED COURSE Patient's dizziness is gone his blood pressure is not that elevated I have refilled his metoprolol.  I will advise him to make sure he eats breakfast every day not just drink water.  Additionally I will have him follow-up with cardiology as his EKG is markedly abnormal.  It has been like this somewhat better or as bad for several years.  He has no symptoms and his troponin is negative he had a negative cath in 2017 but I still do want him to follow-up.  Will return here if he has any further problems.              ____________________________________________   FINAL CLINICAL IMPRESSION(S) / ED DIAGNOSES  Final diagnoses:  Abnormal EKG  Hypertension, unspecified type  Medication refill  Dizziness     ED Discharge Orders         Ordered    metoprolol tartrate (LOPRESSOR) 25 MG tablet  2 times daily        04/04/20 1812          *Please note:  Keagan A Schweickert was evaluated in Emergency Department on 04/04/2020 for the symptoms described in the history of present illness.  He was evaluated in the context of the global COVID-19 pandemic, which necessitated consideration that the patient might be at risk for infection with the SARS-CoV-2 virus that causes COVID-19. Institutional protocols and algorithms that pertain to the evaluation of patients at risk for COVID-19 are in a state of rapid change based on information released by regulatory bodies including the CDC and federal and state organizations. These policies and algorithms were followed during the patient's care in the ED.  Some ED evaluations and interventions may be delayed as a result of limited staffing during and the pandemic.*   Note:  This document was prepared using Dragon voice recognition software and may include unintentional dictation errors.    Arnaldo Natal, MD 04/04/20 [facility]

## 2020-04-04 NOTE — ED Triage Notes (Signed)
PT to ED with episode of dizziness at work. 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 PT did not eat breakfast but has been drinking water. PT with hx of high BP, has been out of meds for 3 days. States coworker took his BP and it was high so they made him come. PT has never had an episode like this. Ambulatory, alert and oriented x 4

## 2020-04-10 ENCOUNTER — Ambulatory Visit: Payer: Self-pay | Admitting: Cardiology

## 2020-04-11 ENCOUNTER — Encounter: Payer: Self-pay | Admitting: Cardiology
